# Patient Record
Sex: Female | Born: 1945 | Race: White | Hispanic: No | Smoking: Never smoker
Health system: Southern US, Community
[De-identification: ages and names within clinical notes are randomized; demographics above are authoritative.]

## PROBLEM LIST (undated history)

## (undated) DIAGNOSIS — K5790 Diverticulosis of intestine, part unspecified, without perforation or abscess without bleeding: Secondary | ICD-10-CM

## (undated) DIAGNOSIS — I10 Essential (primary) hypertension: Secondary | ICD-10-CM

## (undated) DIAGNOSIS — K8020 Calculus of gallbladder without cholecystitis without obstruction: Secondary | ICD-10-CM

## (undated) DIAGNOSIS — E7800 Pure hypercholesterolemia, unspecified: Secondary | ICD-10-CM

## (undated) DIAGNOSIS — D649 Anemia, unspecified: Secondary | ICD-10-CM

## (undated) DIAGNOSIS — E039 Hypothyroidism, unspecified: Secondary | ICD-10-CM

## (undated) DIAGNOSIS — M1990 Unspecified osteoarthritis, unspecified site: Secondary | ICD-10-CM

## (undated) DIAGNOSIS — T7840XA Allergy, unspecified, initial encounter: Secondary | ICD-10-CM

## (undated) DIAGNOSIS — E079 Disorder of thyroid, unspecified: Secondary | ICD-10-CM

## (undated) HISTORY — DX: Allergy, unspecified, initial encounter: T78.40XA

## (undated) HISTORY — PX: BUNIONECTOMY: SHX129

## (undated) HISTORY — PX: DIAGNOSTIC LAPAROSCOPY: SUR761

## (undated) HISTORY — DX: Unspecified osteoarthritis, unspecified site: M19.90

## (undated) HISTORY — DX: Disorder of thyroid, unspecified: E07.9

## (undated) HISTORY — DX: Anemia, unspecified: D64.9

---

## 1998-06-15 ENCOUNTER — Other Ambulatory Visit: Admission: RE | Admit: 1998-06-15 | Discharge: 1998-06-15 | Payer: Self-pay | Admitting: Gynecology

## 2002-02-19 ENCOUNTER — Other Ambulatory Visit: Admission: RE | Admit: 2002-02-19 | Discharge: 2002-02-19 | Payer: Self-pay | Admitting: Obstetrics and Gynecology

## 2003-03-27 ENCOUNTER — Other Ambulatory Visit: Admission: RE | Admit: 2003-03-27 | Discharge: 2003-03-27 | Payer: Self-pay | Admitting: Obstetrics and Gynecology

## 2005-04-06 ENCOUNTER — Other Ambulatory Visit: Admission: RE | Admit: 2005-04-06 | Discharge: 2005-04-06 | Payer: Self-pay | Admitting: Obstetrics and Gynecology

## 2005-06-19 ENCOUNTER — Ambulatory Visit (HOSPITAL_COMMUNITY): Admission: RE | Admit: 2005-06-19 | Discharge: 2005-06-19 | Payer: Self-pay | Admitting: Gastroenterology

## 2006-06-22 ENCOUNTER — Other Ambulatory Visit: Admission: RE | Admit: 2006-06-22 | Discharge: 2006-06-22 | Payer: Self-pay | Admitting: Obstetrics and Gynecology

## 2007-10-11 ENCOUNTER — Other Ambulatory Visit: Admission: RE | Admit: 2007-10-11 | Discharge: 2007-10-11 | Payer: Self-pay | Admitting: Obstetrics & Gynecology

## 2011-03-10 NOTE — Op Note (Signed)
NAME:  Dana Duffy, Dana Duffy               ACCOUNT NO.:  [id number]   MEDICAL RECORD NO.:  [id number]          PATIENT TYPE:  AMB   LOCATION:  ENDO                         FACILITY:  [facility]   PHYSICIAN:  Anselmo Rod, M.D.  DATE OF BIRTH:  September 07, 1946   DATE OF PROCEDURE:  06/19/2005  DATE OF DISCHARGE:                                 OPERATIVE REPORT   PROCEDURE:  Screening colonoscopy.   ENDOSCOPIST:  Charna Elizabeth, M.D.   INSTRUMENT USED:  Olympus video colonoscope.   INDICATIONS FOR PROCEDURE:  65 year old white female with a family history  of colon cancer in a paternal grandparent undergoing screening colonoscopy  to rule out colonic polyps, masses, etc.   PREPROCEDURE PREPARATION:  Informed consent was procured from the patient.  The patient was fasted for eight hours prior to the procedure and prepped  with a bottle of magnesium citrate and a gallon of GoLYTELY the night prior  to the procedure.  The risks and benefits of the procedure including a 10%  miss rate of cancer and polyps were explained to the patient, as well.   PREPROCEDURE PHYSICAL:  Patient with stable vital signs.  Neck supple.  Chest clear to auscultation.  S1 and S2 regular.  Abdomen soft with normal  bowel sounds.   DESCRIPTION OF PROCEDURE:  The patient was placed in the left lateral  decubitus position, sedated with 75 mg of Demerol and 7.5 mg Versed in slow  incremental doses.  Once the patient was adequately sedated, maintained on  low flow oxygen and continuous cardiac monitoring, the Olympus video  colonoscope was advanced into the rectum to the cecum.  The appendiceal  orifice and ileocecal valve were visualized and photographed.  The terminal  ileum appeared healthy and without lesions.  Except for small internal  hemorrhoids seen on retroflexion, no other abnormalities were noted.  The  patient tolerated the procedure well without immediate complications.   IMPRESSION:  Normal colonoscopy to the  terminal ileum except for small  nonbleeding internal hemorrhoids, no masses, polyps, or diverticula seen.   RECOMMENDATIONS:  1.  Continue a high fiber diet with liberal fluid intake.  2.  Repeat colonoscopy in the next ten years unless the patient develops any      abnormal symptoms in the interim.  3.  Outpatient follow up as the need arises in the future.      Anselmo Rod, M.D.  Electronically Signed     JNM/MEDQ  D:  06/19/2005  T:  06/19/2005  Job:  [id number]   cc:   Onalee Hua L. Reed Breech, M.D.  [facility]  [address]  [place], [place] [address]  Fax: [phone]

## 2012-04-16 ENCOUNTER — Ambulatory Visit (INDEPENDENT_AMBULATORY_CARE_PROVIDER_SITE_OTHER): Payer: Medicare Other | Admitting: Family Medicine

## 2012-04-16 VITALS — BP 164/103 | HR 69 | Temp 97.8°F | Resp 16 | Ht 66.0 in | Wt 170.0 lb

## 2012-04-16 DIAGNOSIS — M79671 Pain in right foot: Secondary | ICD-10-CM

## 2012-04-16 DIAGNOSIS — IMO0001 Reserved for inherently not codable concepts without codable children: Secondary | ICD-10-CM

## 2012-04-16 DIAGNOSIS — M79609 Pain in unspecified limb: Secondary | ICD-10-CM

## 2012-04-16 DIAGNOSIS — R21 Rash and other nonspecific skin eruption: Secondary | ICD-10-CM

## 2012-04-16 MED ORDER — DOXYCYCLINE HYCLATE 100 MG PO TABS: 100.0000 mg | ORAL_TABLET | Freq: Two times a day (BID) | ORAL | Status: AC

## 2012-04-16 NOTE — Patient Instructions (Addendum)
Try the hydrocortisone cream on the spot on your arm. I am also sending in an antibiotic for the next 7 days.  I will refer you to a Sports Medicine doctor for your foot.

## 2012-04-16 NOTE — Progress Notes (Signed)
Patient ID: Dana Duffy, female   DOB: 1946-02-04, 66 y.o.   MRN: [id number] Dana Duffy is a 66 y.o. female who [facility] for 2 issues today:  1. Left foot pain:  Chronic issue for patient.  Had bunionectomy several years ago performed by podiatrist here in town.  Pain began roughly 1 year ago in toes, now "spreading" up her foot to her ankle.  Does not some occasional swelling in dorsum of foot and ankle when she is on her feet most of the day.  Given orthotics which provide only intermittent relief.  Takes OTC ASA with some relief.    2.  Bug bite:  Noticed bruise on her Right arm, inner aspect, this AM.  Was not itching at that time.  Did not note any insect bite, no tick.  Has spread with some central clearing as day progresses.    PMH reviewed.  ROS as above otherwise neg Medications reviewed. Current Outpatient Prescriptions  Medication Sig Dispense Refill  . simvastatin (ZOCOR) 40 MG tablet Take 40 mg by mouth every evening.        Exam:  BP 164/103  Pulse 69  Temp 97.8 F (36.6 C) (Oral)  Resp 16  Ht 5\' 6"  (1.676 m)  Wt 170 lb (77.111 kg)  BMI 27.44 kg/m2 Gen: Well NAD HEENT: EOMI,  MMM Lungs: CTABL Nl WOB Heart: RRR no MRG Skin:  2 cm area of erythema with central lesion noted medial aspect Right arm, some bruising noted.  No puncture wound.  No scaling.  No other rashes noted Exts: Non edematous BL  LE, warm and well perfused.  MSK:  Not currently tender.  No limping with ambulation.  No pain with eversion, plantar flexion, dorsiflexion of foot.  Assessment and Plan:  1.  Foot pain:  Likely tendonitis due to chronicity of symptoms.  No rash or red flags noted.  Patient ambulated easily today in clinic.  To continue intermittent ASA with relief, discussed switching to another NSAID but she declined this.   2.  Bug bite:  Unclear etiology of rash.   DOes have central clearing and although no history of tick bite the benefits outweigh the risks with  treating with Doxycycline.  FU if no improvement.

## 2012-04-17 ENCOUNTER — Encounter: Payer: Self-pay | Admitting: Internal Medicine

## 2012-06-12 ENCOUNTER — Encounter: Payer: Self-pay | Admitting: Internal Medicine

## 2012-06-12 ENCOUNTER — Ambulatory Visit (INDEPENDENT_AMBULATORY_CARE_PROVIDER_SITE_OTHER): Payer: Medicare Other | Admitting: Internal Medicine

## 2012-06-12 VITALS — BP 162/86 | HR 60 | Temp 97.2°F | Resp 18 | Ht 66.5 in | Wt 171.0 lb

## 2012-06-12 DIAGNOSIS — E039 Hypothyroidism, unspecified: Secondary | ICD-10-CM

## 2012-06-12 DIAGNOSIS — E785 Hyperlipidemia, unspecified: Secondary | ICD-10-CM

## 2012-06-12 DIAGNOSIS — Z0000 Encounter for general adult medical examination without abnormal findings: Secondary | ICD-10-CM

## 2012-06-12 DIAGNOSIS — Z6827 Body mass index (BMI) 27.0-27.9, adult: Secondary | ICD-10-CM | POA: Insufficient documentation

## 2012-06-12 LAB — CBC WITH DIFFERENTIAL/PLATELET
Basophils Absolute: 0 10*3/uL (ref 0.0–0.1)
HCT: 41 % (ref 36.0–46.0)
Lymphocytes Relative: 37 % (ref 12–46)
Monocytes Absolute: 0.5 10*3/uL (ref 0.1–1.0)
Neutro Abs: 2.3 10*3/uL (ref 1.7–7.7)
RDW: 14 % (ref 11.5–15.5)
WBC: 4.6 10*3/uL (ref 4.0–10.5)

## 2012-06-12 LAB — POCT URINALYSIS DIPSTICK
Glucose, UA: NEGATIVE
Nitrite, UA: NEGATIVE
Urobilinogen, UA: 0.2

## 2012-06-12 NOTE — Progress Notes (Signed)
  Subjective:    Patient ID: Dana Duffy, female    DOB: 01-02-1946, 66 y.o.   MRN: [id number]  HPIF/U Annual checkup Doing well in general Patient Active Problem List  Diagnosis  . BMI 27.0-27.9,adult  . Hypothyroidism  . Hyperlipidemia  Foot pain from 2nd toes hammering has been eval by both pod anf ortho([facility]), both saying "surgery" She has increased yoga and started foot massage and is doing much better!!  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 to support younger sis w/ husband's early alz after a viral illness  Mammo soon/high density zostavax Bone dens-"no" she says fam hx good for her= Mother lived 33s and aunt into the 11s without problems colonscopy =med hemmorr/polyp Bx pending   BP 172/82 at ortho-  Review of Systems Wt gain 163--170/less exer No decrease in activity level HEENT =no sx No nodes CVR neg Lots of energy/sleeping well GI-negat GU-negat Skin=no challerg=mold increased sxt in July/itchy skin also  No neuropsych symptoms of depression Objective:   Physical Exam Filed Vitals:   06/12/12 1130  BP: 162/86  Pulse: 60  Temp: 97.2 F (36.2 C)  Resp: 18  HEENT clear No thyromegaly or lymphadenopathy Heart regular without murmur click Lungs clear Abdomen soft without organomegaly No peripheral edema/4 peripheral pulses/no bruits abdomen or inguinal Neurological intact Mood very good/affect appropriate/judgment intact       Assessment & Plan:   1. Hypothyroid  TSH, T4, Free  2. Hyperlipidemia  CBC with Differential, Comprehensive metabolic panel, Lipid panel  3. Annual physical exam  POCT urinalysis dipstick  4. BMI 27.0-27.9,adult  Discussed ways to change diet and exercise  5.   Increased blood pressure without diagnosis of hypertension 6.   Hammer  toes-foot pain---Continue current plan   Will check labs then adjust meds and describe approach to HTN

## 2012-06-13 LAB — COMPREHENSIVE METABOLIC PANEL
ALT: 25 U/L (ref 0–35)
AST: 27 U/L (ref 0–37)
Albumin: 4.3 g/dL (ref 3.5–5.2)
Calcium: 9.4 mg/dL (ref 8.4–10.5)
Chloride: 104 mEq/L (ref 96–112)
Potassium: 4 mEq/L (ref 3.5–5.3)
Sodium: 141 mEq/L (ref 135–145)
Total Protein: 6.5 g/dL (ref 6.0–8.3)

## 2012-06-13 LAB — T4, FREE: Free T4: 1.43 ng/dL (ref 0.80–1.80)

## 2012-06-13 LAB — LIPID PANEL: LDL Cholesterol: 122 mg/dL — ABNORMAL HIGH (ref 0–99)

## 2012-06-16 ENCOUNTER — Encounter: Payer: Self-pay | Admitting: Internal Medicine

## 2012-06-16 ENCOUNTER — Other Ambulatory Visit: Payer: Self-pay | Admitting: Internal Medicine

## 2012-06-29 ENCOUNTER — Other Ambulatory Visit: Payer: Self-pay | Admitting: Physician Assistant

## 2012-07-01 ENCOUNTER — Telehealth: Payer: Self-pay | Admitting: *Deleted

## 2012-07-01 MED ORDER — LEVOTHYROXINE SODIUM 100 MCG PO TABS: 100.0000 ug | ORAL_TABLET | Freq: Every day | ORAL | Status: DC

## 2012-07-01 NOTE — Telephone Encounter (Signed)
rx sent

## 2012-07-01 NOTE — Telephone Encounter (Signed)
rx refill for Sythroid last fill #90 06/06

## 2012-07-01 NOTE — Telephone Encounter (Signed)
Called pt [facility] RX sent in. 

## 2012-07-02 ENCOUNTER — Other Ambulatory Visit: Payer: Self-pay | Admitting: Family Medicine

## 2012-09-04 ENCOUNTER — Encounter: Payer: Self-pay | Admitting: Physician Assistant

## 2012-09-16 ENCOUNTER — Other Ambulatory Visit: Payer: Self-pay | Admitting: Physician Assistant

## 2012-12-18 ENCOUNTER — Other Ambulatory Visit: Payer: Self-pay | Admitting: Physician Assistant

## 2012-12-19 ENCOUNTER — Telehealth: Payer: Self-pay

## 2012-12-19 MED ORDER — LEVOTHYROXINE SODIUM 100 MCG PO TABS: 100.0000 ug | ORAL_TABLET | Freq: Every day | ORAL | Status: DC

## 2012-12-19 MED ORDER — SIMVASTATIN 20 MG PO TABS: 20.0000 mg | ORAL_TABLET | Freq: Every day | ORAL | Status: DC

## 2012-12-19 NOTE — Telephone Encounter (Signed)
error 

## 2013-04-16 ENCOUNTER — Encounter: Payer: Medicare Other | Admitting: Internal Medicine

## 2013-04-18 ENCOUNTER — Encounter: Payer: Medicare Other | Admitting: Internal Medicine

## 2013-05-07 ENCOUNTER — Encounter: Payer: Self-pay | Admitting: Internal Medicine

## 2013-05-07 ENCOUNTER — Ambulatory Visit (INDEPENDENT_AMBULATORY_CARE_PROVIDER_SITE_OTHER): Payer: Medicare Other | Admitting: Internal Medicine

## 2013-05-07 VITALS — BP 150/80 | HR 62 | Temp 98.4°F | Resp 16 | Ht 66.0 in | Wt 168.2 lb

## 2013-05-07 DIAGNOSIS — E039 Hypothyroidism, unspecified: Secondary | ICD-10-CM

## 2013-05-07 DIAGNOSIS — E785 Hyperlipidemia, unspecified: Secondary | ICD-10-CM

## 2013-05-07 DIAGNOSIS — Z6827 Body mass index (BMI) 27.0-27.9, adult: Secondary | ICD-10-CM

## 2013-05-07 LAB — COMPREHENSIVE METABOLIC PANEL
ALT: 16 U/L (ref 0–35)
AST: 22 U/L (ref 0–37)
Albumin: 4 g/dL (ref 3.5–5.2)
Calcium: 9.4 mg/dL (ref 8.4–10.5)
Chloride: 105 mEq/L (ref 96–112)
Potassium: 4.2 mEq/L (ref 3.5–5.3)
Sodium: 140 mEq/L (ref 135–145)
Total Protein: 6.4 g/dL (ref 6.0–8.3)

## 2013-05-07 LAB — LIPID PANEL
LDL Cholesterol: 96 mg/dL (ref 0–99)
Triglycerides: 50 mg/dL (ref <150)
VLDL: 10 mg/dL (ref 0–40)

## 2013-05-07 LAB — CBC WITH DIFFERENTIAL/PLATELET
Basophils Absolute: 0 10*3/uL (ref 0.0–0.1)
Eosinophils Absolute: 0.1 10*3/uL (ref 0.0–0.7)
Eosinophils Relative: 3 % (ref 0–5)
MCH: 31.6 pg (ref 26.0–34.0)
MCV: 93 fL (ref 78.0–100.0)
Platelets: 196 10*3/uL (ref 150–400)
RDW: 13.7 % (ref 11.5–15.5)
WBC: 5.1 10*3/uL (ref 4.0–10.5)

## 2013-05-07 NOTE — Progress Notes (Signed)
  Subjective:    Patient ID: Dana Duffy, female    DOB: 1946/07/28, 67 y.o.   MRN: [id number]  HPI  Patient Active Problem List   Diagnosis Date Noted  . BMI 27.0-27.9,adult 06/12/2012  . Hypothyroidism 06/12/2012  . Hyperlipidemia 06/12/2012  Doing well Work-artist/restoration Divorced  At least one grandchild Son in [place] and one at [place] [employer] Hammer toes-g [place]  Review of Systems All negative x gen aches and pains that improve w/ activity    Objective:   Physical Exam BP 150/80  Pulse 62  Temp(Src) 98.4 F (36.9 C) (Oral)  Resp 16  Ht 5\' 6"  (1.676 m)  Wt 168 lb 3.2 oz (76.295 kg)  BMI 27.16 kg/m2  SpO2 98% HEENT clear Ht reg extr clear x htoe R#2 Neuro intact psy stab       Assessment & Plan:  BMI 27.0-27.9,adult  Hypothyroidism - Plan: CBC with Differential, TSH, T4, free  Hyperlipidemia - Plan: Comprehensive metabolic panel, Lipid panel  meds after labs Contin wt loss

## 2013-05-09 ENCOUNTER — Encounter: Payer: Self-pay | Admitting: Internal Medicine

## 2013-05-09 MED ORDER — SIMVASTATIN 20 MG PO TABS: 20.0000 mg | ORAL_TABLET | Freq: Every day | ORAL | Status: DC

## 2013-05-09 MED ORDER — LEVOTHYROXINE SODIUM 100 MCG PO TABS: 100.0000 ug | ORAL_TABLET | Freq: Every day | ORAL | Status: DC

## 2013-05-09 NOTE — Addendum Note (Signed)
Addended by: Tonye Pearson on: 05/09/2013 02:49 PM   Modules accepted: Orders, Medications

## 2013-06-18 ENCOUNTER — Encounter: Payer: Medicare Other | Admitting: Internal Medicine

## 2013-07-02 ENCOUNTER — Encounter: Payer: Self-pay | Admitting: Internal Medicine

## 2013-07-02 ENCOUNTER — Ambulatory Visit (INDEPENDENT_AMBULATORY_CARE_PROVIDER_SITE_OTHER): Payer: Medicare Other | Admitting: Internal Medicine

## 2013-07-02 VITALS — BP 142/84 | HR 59 | Temp 98.0°F | Resp 16 | Ht 66.75 in | Wt 167.6 lb

## 2013-07-02 DIAGNOSIS — Z23 Encounter for immunization: Secondary | ICD-10-CM

## 2013-07-02 DIAGNOSIS — Z0000 Encounter for general adult medical examination without abnormal findings: Secondary | ICD-10-CM

## 2013-07-02 NOTE — Progress Notes (Signed)
  Subjective:    Patient ID: Dana Duffy, female    DOB: 06-Sep-1946, 67 y.o.   MRN: [id number]  HPIannual exam Patient Active Problem List   Diagnosis Date Noted  . Erroneous encounter - disregard 07/02/2012  . BMI 27.0-27.9,adult 06/12/2012  . Hypothyroidism 06/12/2012  . Hyperlipidemia 06/12/2012   Current outpatient prescriptions:acidophilus (RISAQUAD) CAPS, Take by mouth daily., Disp: , Rfl: ;  Calcium Carbonate (CALCIUM 600 PO), Take by mouth daily., Disp: , Rfl: ;  Cholecalciferol (VITAMIN D) 1000 UNITS capsule, Take 1,000 Units by mouth daily., Disp: , Rfl: ;  levothyroxine (SYNTHROID) 100 MCG tablet, Take 1 tablet (100 mcg total) by mouth daily., Disp: 90 tablet, Rfl: 3 simvastatin (ZOCOR) 20 MG tablet, Take 1 tablet (20 mg total) by mouth at bedtime., Disp: 90 tablet, Rfl: 3  Everything ok except has occasional constipation. Uses metamucil type medicine.Has had improvement with drinking coffee first thing in the morning.  Last pap 2012, last mammogram 2103      Review of Systems  Constitutional: Negative for fever, activity change, appetite change, fatigue and unexpected weight change.  HENT: Negative for hearing loss, congestion, rhinorrhea, trouble swallowing, neck pain and dental problem.   Eyes: Negative for photophobia and visual disturbance.  Respiratory: Negative for cough, shortness of breath and wheezing.   Cardiovascular: Negative for chest pain, palpitations and leg swelling.  Gastrointestinal: Negative for abdominal pain, diarrhea, constipation and blood in stool.  Genitourinary: Negative for difficulty urinating, menstrual problem, pelvic pain and dyspareunia.  Musculoskeletal: Negative for myalgias, back pain, joint swelling, arthralgias and gait problem.  Skin: Negative for rash.  Neurological: Negative for dizziness, speech difficulty and headaches.  Hematological: Negative for adenopathy. Does not bruise/bleed easily.  Psychiatric/Behavioral: Negative for  behavioral problems, sleep disturbance and dysphoric mood.       Objective:   Physical Exam  Constitutional: She is oriented to person, place, and time. She appears well-developed and well-nourished.  HENT:  Right Ear: Tympanic membrane, external ear and ear canal normal.  Left Ear: Tympanic membrane, external ear and ear canal normal.  Nose: Nose normal.  Mouth/Throat: Oropharynx is clear and moist.  Eyes: Conjunctivae and EOM are normal. Pupils are equal, round, and reactive to light.  Neck: Normal range of motion. Neck supple. No thyromegaly present.  Cardiovascular: Normal rate, regular rhythm and normal heart sounds.   Pulmonary/Chest: Effort normal and breath sounds normal. She has no wheezes.  Abdominal: Soft. Bowel sounds are normal. She exhibits no distension and no mass. There is no tenderness. There is no rebound and no guarding.  Genitourinary: Vagina normal and uterus normal. No vaginal discharge found.  Musculoskeletal: Normal range of motion. She exhibits no edema.  Lymphadenopathy:    She has no cervical adenopathy.  Neurological: She is alert and oriented to person, place, and time. She has normal reflexes. No cranial nerve deficit.  Skin: Skin is warm and dry.  Psychiatric: She has a normal mood and affect. Her behavior is normal. Judgment and thought content normal.          Assessment & Plan:  Annual physical exam - Plan: Pap IG and HPV (high risk) DNA detection, CANCELED: Pap IG and HPV (high risk) DNA detection  Need for prophylactic vaccination and inoculation against influenza - Plan: Flu Vaccine QUAD 36+ mos IM, Pap IG and HPV (high risk) DNA detection  Labs were in July Ok to ref meds thru next sept if calls

## 2013-07-02 NOTE — Progress Notes (Signed)
  Subjective:    Patient ID: Dana Duffy, female    DOB: 08-21-1946, 67 y.o.   MRN: [id number]  HPI    Review of Systems  Constitutional: Negative.   HENT: Negative.   Eyes: Negative.   Respiratory: Negative.   Cardiovascular: Positive for leg swelling.  Gastrointestinal: Negative.   Endocrine: Negative.   Genitourinary: Negative.   Musculoskeletal: Positive for myalgias and back pain.  Skin: Negative.   Allergic/Immunologic: Negative.   Neurological: Negative.   Hematological: Negative.   Psychiatric/Behavioral: Negative.        Objective:   Physical Exam        Assessment & Plan:

## 2013-07-04 LAB — PAP IG AND HPV HIGH-RISK: HPV DNA High Risk: NOT DETECTED

## 2013-07-10 ENCOUNTER — Encounter: Payer: Self-pay | Admitting: Internal Medicine

## 2013-09-22 LAB — HM MAMMOGRAPHY

## 2013-10-02 ENCOUNTER — Ambulatory Visit (INDEPENDENT_AMBULATORY_CARE_PROVIDER_SITE_OTHER): Payer: Medicare Other | Admitting: Internal Medicine

## 2013-10-02 VITALS — BP 126/92 | HR 62 | Temp 97.7°F | Resp 18 | Ht 66.0 in | Wt 166.0 lb

## 2013-10-02 DIAGNOSIS — L299 Pruritus, unspecified: Secondary | ICD-10-CM

## 2013-10-02 DIAGNOSIS — L259 Unspecified contact dermatitis, unspecified cause: Secondary | ICD-10-CM

## 2013-10-02 MED ORDER — METHYLPREDNISOLONE ACETATE 80 MG/ML IJ SUSP
80.0000 mg | Freq: Once | INTRAMUSCULAR | Status: AC
  Administered 2013-10-02: 80 mg via INTRAMUSCULAR

## 2013-10-02 MED ORDER — PREDNISONE 10 MG PO TABS: ORAL_TABLET | ORAL | Status: DC

## 2013-10-02 MED ORDER — CLOBETASOL PROPIONATE 0.05 % EX OINT: 1.0000 "application " | TOPICAL_OINTMENT | Freq: Two times a day (BID) | CUTANEOUS | Status: DC

## 2013-10-02 NOTE — Progress Notes (Signed)
   Subjective:    Patient ID: Dana Duffy, female    DOB: July 15, 1946, 67 y.o.   MRN: [id number]  HPI Patient comes into our office with a rash that started under her right breast then that went away then it went to her left breast to the side of her left hip and in the middle of her back this has been going on for about a month now. She has used OTC creams for the itch and redness. Doesn't remember using any new soaps or perfumes no new clothing. Patient was outside recently raking leaves. She do have two cats. The rash doesn't doesn't hurt only when she scratches it    Review of Systems  Constitutional: Negative for fever and chills.  Gastrointestinal: Negative for nausea and vomiting.  Skin: Positive for rash.       Objective:   Physical Exam        Assessment & Plan:

## 2013-10-02 NOTE — Patient Instructions (Signed)

## 2013-10-12 ENCOUNTER — Ambulatory Visit: Payer: Medicare Other

## 2013-10-12 ENCOUNTER — Ambulatory Visit (INDEPENDENT_AMBULATORY_CARE_PROVIDER_SITE_OTHER): Payer: Medicare Other | Admitting: Family Medicine

## 2013-10-12 VITALS — BP 120/70 | HR 60 | Temp 98.0°F | Resp 16 | Ht 66.0 in | Wt 166.0 lb

## 2013-10-12 DIAGNOSIS — R002 Palpitations: Secondary | ICD-10-CM

## 2013-10-12 DIAGNOSIS — R0609 Other forms of dyspnea: Secondary | ICD-10-CM

## 2013-10-12 DIAGNOSIS — R0989 Other specified symptoms and signs involving the circulatory and respiratory systems: Secondary | ICD-10-CM

## 2013-10-12 DIAGNOSIS — E039 Hypothyroidism, unspecified: Secondary | ICD-10-CM

## 2013-10-12 DIAGNOSIS — J0100 Acute maxillary sinusitis, unspecified: Secondary | ICD-10-CM

## 2013-10-12 DIAGNOSIS — R0600 Dyspnea, unspecified: Secondary | ICD-10-CM

## 2013-10-12 LAB — POCT CBC
Granulocyte percent: 64.8 %G (ref 37–80)
HCT, POC: 43.7 % (ref 37.7–47.9)
Hemoglobin: 13.4 g/dL (ref 12.2–16.2)
Lymph, poc: 2.2 (ref 0.6–3.4)
MCH, POC: 30.9 pg (ref 27–31.2)
MCHC: 30.7 g/dL — AB (ref 31.8–35.4)
MCV: 101 fL — AB (ref 80–97)
MID (cbc): 0.5 (ref 0–0.9)
Platelet Count, POC: 189 10*3/uL (ref 142–424)
RBC: 4.33 M/uL (ref 4.04–5.48)
WBC: 7.5 10*3/uL (ref 4.6–10.2)

## 2013-10-12 MED ORDER — AMOXICILLIN 500 MG PO CAPS: 1000.0000 mg | ORAL_CAPSULE | Freq: Two times a day (BID) | ORAL | Status: DC

## 2013-10-12 NOTE — Patient Instructions (Signed)
1.  Please present to emergency department for worsening palpitations. 2. We will contact you in the upcoming week for cardiology appointment.

## 2013-10-12 NOTE — Progress Notes (Addendum)
Subjective:    Patient ID: Dana Duffy, female    DOB: 11-06-1945, 67 y.o.   MRN: [id number] This chart was scribed for Ethelda Chick, MD by Danella Maiers, ED Scribe. This patient was seen in room 10 and the patient's care was started at 4:08 PM.  Chief Complaint  Patient presents with  . Sinusitis    x  . Palpitations    not now    HPI HPI Comments: Dana Duffy is a 67 y.o. female with a h/o hypothyroidism who presents to the [facility] complaining of palpitations described as "heart is jumping" over the last 4-5 days. Pt states she feels like her heart is racing although she doesn't think she is actually tachycardic. She states yesterday she got very SOB after going up 4 flights of stairs which is unusual for her. She denies SOB when walking around the house or when laying down at night. She reports posterior headache last night. She denies prior h/o jumping heart. She denies CP, fever, chills, sweats, leg swelling. She denies recent changes in medications.  Pt is also c/o sinus infection intermittently over the last 4-5 days since finishing a course of prednisone. She reports light-headedness and states she thinks it is related to the sinus infection. She reports mild drainage in the back of her throat this morning. She reports mild nausea that did not last long. She reports she has a h/o frequent sinus infections. She states she slept with her humidifier last night and felt a little better today.  She also reports a lump in her throat described as feeling like something got stuck in her throat. She states this has partially resolved. She has a h/o similar symptoms and states it is due to gallbladder symptoms.   PCP - Dr Merla Riches    Past Medical History  Diagnosis Date  . Allergy   . Arthritis   . Anemia   . Thyroid disease    Current Outpatient Prescriptions on File Prior to Visit  Medication Sig Dispense Refill  . acidophilus (RISAQUAD) CAPS Take by  mouth daily.      . Calcium Carbonate (CALCIUM 600 PO) Take by mouth daily.      . Cholecalciferol (VITAMIN D) 1000 UNITS capsule Take 1,000 Units by mouth daily.      Marland Kitchen levothyroxine (SYNTHROID) 100 MCG tablet Take 1 tablet (100 mcg total) by mouth daily.  90 tablet  3  . Psyllium (METAMUCIL PO) Take by mouth.      . simvastatin (ZOCOR) 20 MG tablet Take 1 tablet (20 mg total) by mouth at bedtime.  90 tablet  3  . DiphenhydrAMINE HCl (BENADRYL ALLERGY PO) Take by mouth.       No current facility-administered medications on file prior to visit.   Allergies  Allergen Reactions  . Sulfa Antibiotics Other (See Comments)    SKIN REDNESS    Review of Systems  Constitutional: Negative for fever, chills and diaphoresis.  HENT: Positive for postnasal drip and sinus pressure.   Respiratory: Positive for shortness of breath (after going up stairs but not when lying down).   Cardiovascular: Positive for palpitations. Negative for chest pain and leg swelling.  Gastrointestinal: Positive for nausea.  Neurological: Positive for headaches.       Objective:   Physical Exam  Nursing note and vitals reviewed. Constitutional: She is oriented to person, place, and time. She appears well-developed and well-nourished. No distress.  HENT:  Head: Normocephalic and atraumatic.  Right Ear: Tympanic membrane normal.  Left Ear: Tympanic membrane normal.  Nose: Nose normal.  Mouth/Throat: Oropharyngeal exudate and posterior oropharyngeal erythema present.  Eyes: EOM are normal.  Neck: Neck supple. Carotid bruit is not present. No tracheal deviation present.  Cardiovascular: Normal rate and regular rhythm.   Murmur (1/6 at the left stomach border) heard.  Systolic murmur is present with a grade of 1/6  Pulses:      Dorsalis pedis pulses are 2+ on the right side, and 2+ on the left side.  No leg swelling  Pulmonary/Chest: Effort normal and breath sounds normal. No respiratory distress. She has no  wheezes. She has no rhonchi. She has no rales.  Musculoskeletal: Normal range of motion.  Lymphadenopathy:    She has no cervical adenopathy.  Neurological: She is alert and oriented to person, place, and time.  Skin: Skin is warm and dry.  Psychiatric: She has a normal mood and affect. Her behavior is normal.    Filed Vitals:   10/12/13 1603  BP: 120/70  Pulse: 60  Temp: 98 F (36.7 C)  TempSrc: Oral  Resp: 16  Height: 5\' 6"  (1.676 m)  Weight: 166 lb (75.297 kg)  SpO2: 98%   Results for orders placed in visit on 10/12/13  COMPREHENSIVE METABOLIC PANEL      Result Value Ref Range   Sodium 139  135 - 145 mEq/L   Potassium 4.0  3.5 - 5.3 mEq/L   Chloride 104  96 - 112 mEq/L   CO2 27  19 - 32 mEq/L   Glucose, Bld 96  70 - 99 mg/dL   BUN 21  6 - 23 mg/dL   Creat 1.61  0.96 - 0.45 mg/dL   Total Bilirubin 1.0  0.3 - 1.2 mg/dL   Alkaline Phosphatase 63  39 - 117 U/L   AST 17  0 - 37 U/L   ALT 11  0 - 35 U/L   Total Protein 6.3  6.0 - 8.3 g/dL   Albumin 3.8  3.5 - 5.2 g/dL   Calcium 9.3  8.4 - 40.9 mg/dL  TSH      Result Value Ref Range   TSH 0.659  0.350 - 4.500 uIU/mL  POCT CBC      Result Value Ref Range   WBC 7.5  4.6 - 10.2 K/uL   Lymph, poc 2.2  0.6 - 3.4   POC LYMPH PERCENT 28.7  10 - 50 %L   MID (cbc) 0.5  0 - 0.9   POC MID % 6.5  0 - 12 %M   POC Granulocyte 4.9  2 - 6.9   Granulocyte percent 64.8  37 - 80 %G   RBC 4.33  4.04 - 5.48 M/uL   Hemoglobin 13.4  12.2 - 16.2 g/dL   HCT, POC 81.1  91.4 - 47.9 %   MCV 101.0 (*) 80 - 97 fL   MCH, POC 30.9  27 - 31.2 pg   MCHC 30.7 (*) 31.8 - 35.4 g/dL   RDW, POC 78.2     Platelet Count, POC 189  142 - 424 K/uL   MPV 9.2  0 - 99.8 fL   [facility] reading (PRIMARY) by  Dr. Katrinka Blazing.  CXR:  NAD  EKG:  Sinus brady at 52; no ST changes; no arrhythmia.       Assessment & Plan:   1. Palpitations   2. Acute maxillary sinusitis   3. Hypothyroidism   4. Dyspnea  1. Palpitations: New.  Normal EKG with sinus bradycardia.   Obtain labs.  Possibly side effect of recent Prednisone taper; if so, palpitations should not recur. If does persists, refer to cardiology for Holter or Event monitor. 2.  Acute maxillary sinusitis: New. Rx for Amoxicillin provided.   3. Hypothyroidism: stable; obtain labs.   4. Dyspnea on exertion:  New.  Normal CXR; normal respiratory exam; normal EKG.  Obtain labs. If worsens or persists, contact office.  Possibly related to acute illness.  Meds ordered this encounter  Medications  . DISCONTD: amoxicillin (AMOXIL) 500 MG capsule    Sig: Take 2 capsules (1,000 mg total) by mouth 2 (two) times daily.    Dispense:  40 capsule    Refill:  0    I personally performed the services described in this documentation, which was scribed in my presence.  The recorded information has been reviewed and is accurate.  Nilda Simmer, M.D.  [facility] [address] [place], [place]  [address] [phone] phone [phone] fax

## 2013-10-13 LAB — COMPREHENSIVE METABOLIC PANEL
BUN: 21 mg/dL (ref 6–23)
CO2: 27 mEq/L (ref 19–32)
Calcium: 9.3 mg/dL (ref 8.4–10.5)
Chloride: 104 mEq/L (ref 96–112)
Creat: 0.76 mg/dL (ref 0.50–1.10)
Glucose, Bld: 96 mg/dL (ref 70–99)
Total Protein: 6.3 g/dL (ref 6.0–8.3)

## 2013-10-13 LAB — TSH: TSH: 0.659 u[IU]/mL (ref 0.350–4.500)

## 2013-10-20 ENCOUNTER — Encounter: Payer: Self-pay | Admitting: Internal Medicine

## 2013-10-21 ENCOUNTER — Telehealth: Payer: Self-pay

## 2013-10-21 NOTE — Telephone Encounter (Signed)
Patient called about labs, advised patient Dr. Katrinka Blazing will review labs.   Patient states understanding.

## 2013-10-24 ENCOUNTER — Encounter: Payer: Self-pay | Admitting: Internal Medicine

## 2013-10-29 ENCOUNTER — Encounter: Payer: Self-pay | Admitting: Family Medicine

## 2013-10-29 ENCOUNTER — Encounter: Payer: Self-pay | Admitting: Radiology

## 2014-04-05 ENCOUNTER — Ambulatory Visit (INDEPENDENT_AMBULATORY_CARE_PROVIDER_SITE_OTHER): Payer: Medicare Other | Admitting: Internal Medicine

## 2014-04-05 VITALS — BP 150/76 | HR 65 | Temp 98.0°F | Resp 20 | Ht 65.5 in | Wt 164.4 lb

## 2014-04-05 DIAGNOSIS — S139XXA Sprain of joints and ligaments of unspecified parts of neck, initial encounter: Secondary | ICD-10-CM

## 2014-04-05 DIAGNOSIS — R030 Elevated blood-pressure reading, without diagnosis of hypertension: Secondary | ICD-10-CM

## 2014-04-05 DIAGNOSIS — S161XXA Strain of muscle, fascia and tendon at neck level, initial encounter: Secondary | ICD-10-CM

## 2014-04-05 NOTE — Progress Notes (Signed)
   Subjective:    Patient ID: Dana Duffy, female    DOB: January 18, 1946, 68 y.o.   MRN: [id number]  HPI she has had slightly elevated blood pressure since her colonoscopy 5 days ago She has also had neck discomfort for the past 4 days There is no clear injury This pain is not associated with any weakness or sensory abnormalities She does not have a prior neck problem There no headaches although she feels somewhat tender into her occipital area bilaterally   Patient Active Problem List   Diagnosis Date Noted  . BMI 27.0-27.9,adult 06/12/2012  . Hypothyroidism 06/12/2012  . Hyperlipidemia 06/12/2012   Current outpatient prescriptions:acidophilus (RISAQUAD) CAPS, Take by mouth daily., Disp: , Rfl: ;  Calcium Carbonate (CALCIUM 600 PO), Take by mouth daily., Disp: , Rfl: ;  Cholecalciferol (VITAMIN D) 1000 UNITS capsule, Take 1,000 Units by mouth daily., Disp: , Rfl: ;  DiphenhydrAMINE HCl (BENADRYL ALLERGY PO), Take by mouth., Disp: , Rfl:  levothyroxine (SYNTHROID) 100 MCG tablet, Take 1 tablet (100 mcg total) by mouth daily., Disp: 90 tablet, Rfl: 3;  Psyllium (METAMUCIL PO), Take by mouth., Disp: , Rfl: ;  simvastatin (ZOCOR) 20 MG tablet, Take 1 tablet (20 mg total) by mouth at bedtime., Disp: 90 tablet, Rfl: 3  Review of Systems Today gi upset-nausea w/lots of BMs but no vom or diarr//roomate has same    Objective:   Physical Exam BP 150/76  Pulse 65  Temp(Src) 98 F (36.7 C) (Oral)  Resp 20  Ht 5' 5.5" (1.664 m)  Wt 164 lb 6.4 oz (74.571 kg)  BMI 26.93 kg/m2  SpO2 96% No acute distress HEENT clear Heart regular without murmur She is tender in the right trapezius and the right posterior cervical area with pain on range of motion that is mild Deep tendon reflexes are preserved No sensory losses are noted in the extremities       Assessment & Plan:  Cervical strain--I think this is related to the position she was in during her colonoscopy and should resolve with range of  motion, heat and ice, and some specific yoga movements  Increased blood pressure (not hypertension)  This will be followed as it is probably secondary to the above  She would prefer to take no medicines

## 2014-04-10 ENCOUNTER — Encounter: Payer: Self-pay | Admitting: Internal Medicine

## 2014-04-27 ENCOUNTER — Encounter: Payer: Medicare Other | Admitting: Family Medicine

## 2014-06-08 ENCOUNTER — Encounter: Payer: Self-pay | Admitting: Internal Medicine

## 2014-06-15 ENCOUNTER — Encounter: Payer: Self-pay | Admitting: Family Medicine

## 2014-06-15 ENCOUNTER — Ambulatory Visit (INDEPENDENT_AMBULATORY_CARE_PROVIDER_SITE_OTHER): Payer: Medicare Other | Admitting: Family Medicine

## 2014-06-15 VITALS — BP 160/90 | HR 56 | Temp 97.8°F | Resp 16 | Ht 66.0 in | Wt 163.6 lb

## 2014-06-15 DIAGNOSIS — M2042 Other hammer toe(s) (acquired), left foot: Secondary | ICD-10-CM

## 2014-06-15 DIAGNOSIS — R030 Elevated blood-pressure reading, without diagnosis of hypertension: Secondary | ICD-10-CM

## 2014-06-15 DIAGNOSIS — M2040 Other hammer toe(s) (acquired), unspecified foot: Secondary | ICD-10-CM

## 2014-06-15 DIAGNOSIS — E785 Hyperlipidemia, unspecified: Secondary | ICD-10-CM

## 2014-06-15 DIAGNOSIS — Z0000 Encounter for general adult medical examination without abnormal findings: Secondary | ICD-10-CM

## 2014-06-15 DIAGNOSIS — E039 Hypothyroidism, unspecified: Secondary | ICD-10-CM

## 2014-06-15 LAB — CBC
HCT: 42.3 % (ref 36.0–46.0)
Hemoglobin: 14.4 g/dL (ref 12.0–15.0)
MCH: 31.2 pg (ref 26.0–34.0)
MCHC: 34 g/dL (ref 30.0–36.0)
MCV: 91.6 fL (ref 78.0–100.0)
Platelets: 190 10*3/uL (ref 150–400)
RBC: 4.62 MIL/uL (ref 3.87–5.11)
RDW: 13.8 % (ref 11.5–15.5)
WBC: 4.6 10*3/uL (ref 4.0–10.5)

## 2014-06-15 LAB — LIPID PANEL
Cholesterol: 195 mg/dL (ref 0–200)
HDL: 83 mg/dL (ref >39)
LDL Cholesterol: 99 mg/dL (ref 0–99)
Total CHOL/HDL Ratio: 2.3 Ratio
Triglycerides: 66 mg/dL (ref <150)
VLDL: 13 mg/dL (ref 0–40)

## 2014-06-15 LAB — POCT URINALYSIS DIPSTICK
Bilirubin, UA: NEGATIVE
Glucose, UA: NEGATIVE
Ketones, UA: NEGATIVE
Nitrite, UA: NEGATIVE
Protein, UA: NEGATIVE
Spec Grav, UA: <=1.005
Urobilinogen, UA: 0.2
pH, UA: 6

## 2014-06-15 LAB — COMPREHENSIVE METABOLIC PANEL
ALT: 13 U/L (ref 0–35)
AST: 21 U/L (ref 0–37)
Albumin: 4.5 g/dL (ref 3.5–5.2)
Alkaline Phosphatase: 75 U/L (ref 39–117)
BUN: 16 mg/dL (ref 6–23)
CO2: 29 mEq/L (ref 19–32)
Calcium: 9.8 mg/dL (ref 8.4–10.5)
Chloride: 104 mEq/L (ref 96–112)
Creat: 0.63 mg/dL (ref 0.50–1.10)
Glucose, Bld: 92 mg/dL (ref 70–99)
Potassium: 4.4 mEq/L (ref 3.5–5.3)
Sodium: 140 mEq/L (ref 135–145)
Total Bilirubin: 1.5 mg/dL — ABNORMAL HIGH (ref 0.2–1.2)
Total Protein: 7.1 g/dL (ref 6.0–8.3)

## 2014-06-15 LAB — TSH: TSH: 0.821 u[IU]/mL (ref 0.350–4.500)

## 2014-06-15 MED ORDER — SIMVASTATIN 20 MG PO TABS: 20.0000 mg | ORAL_TABLET | Freq: Every day | ORAL | Status: DC

## 2014-06-15 MED ORDER — LEVOTHYROXINE SODIUM 100 MCG PO TABS: 100.0000 ug | ORAL_TABLET | Freq: Every day | ORAL | Status: DC

## 2014-06-15 NOTE — Patient Instructions (Signed)

## 2014-06-15 NOTE — Progress Notes (Signed)
   Subjective:    Patient ID: Dana Duffy, female    DOB: 1946/02/14, 68 y.o.   MRN: [id number]  HPI Patient here for CPE.    Patient does yoga. Teaches and creates art.  Patient due for eye check (corrective lenses) Patient up to date on colonoscopy, Tdap, mammogram.  No further palpitations.  Last December patient was referred to cardiology and had negative monitor.  Palpitations subsequently attributed to a course of prednisone.  Patient has had lifelong heat intolerance. Patient has h/o brown recluse spider bite 2 years ago. Review of Systems  Constitutional: Negative.   HENT: Negative.   Eyes: Positive for itching.  Respiratory: Negative.   Cardiovascular: Negative.   Gastrointestinal: Negative.   Endocrine: Positive for heat intolerance.  Genitourinary: Negative.   Musculoskeletal: Positive for arthralgias, back pain and neck stiffness.  Skin: Negative.   Allergic/Immunologic: Positive for environmental allergies.  Neurological: Negative.   Hematological: Negative.   Psychiatric/Behavioral: Negative.        Objective:   Physical Exam Alert, articulate, NAD, weight appropriate Head:  Normal cephalic Eyes:  Wearing corrective lenses Ears:  Normal hearing Oropharynx: good dentition Neck: supple, no bruit, no thyromegaly Chest: clear Heart: reg, no murmur Abdomen: soft, nontender Breast exam:  No mass Ext:  Hammer toe left 2nd toe, good pulses, no edema     Assessment & Plan:  Annual physical exam - Plan: Comprehensive metabolic panel  Unspecified hypothyroidism - Plan: Vit D  25 hydroxy (rtn osteoporosis monitoring), TSH  Other and unspecified hyperlipidemia - Plan: Vit D  25 hydroxy (rtn osteoporosis monitoring), Lipid panel  Elevated blood pressure reading without diagnosis of hypertension - Plan: CBC, POCT urinalysis dipstick  Hammer toe, left - Plan: Ambulatory referral to Sports Medicine  Signed, Robyn Haber, MD

## 2014-06-16 LAB — VITAMIN D 25 HYDROXY (VIT D DEFICIENCY, FRACTURES): Vit D, 25-Hydroxy: 38 ng/mL (ref 30–89)

## 2014-06-18 ENCOUNTER — Encounter: Payer: Self-pay | Admitting: Sports Medicine

## 2014-06-18 ENCOUNTER — Ambulatory Visit (INDEPENDENT_AMBULATORY_CARE_PROVIDER_SITE_OTHER): Payer: BC Managed Care – PPO | Admitting: Sports Medicine

## 2014-06-18 VITALS — BP 161/73 | Ht 66.0 in | Wt 163.0 lb

## 2014-06-18 DIAGNOSIS — M2040 Other hammer toe(s) (acquired), unspecified foot: Secondary | ICD-10-CM

## 2014-06-18 DIAGNOSIS — M2042 Other hammer toe(s) (acquired), left foot: Secondary | ICD-10-CM | POA: Insufficient documentation

## 2014-06-18 NOTE — Assessment & Plan Note (Signed)
Patient has a hammertoe deformity of the second left toe. This has been causing a lot of irritation with rubbing on the medial and lateral aspects of her 1st and third toe. Patient was provided with gel toe separators to help with cushioning. She was also provided with a metatarsal pad to help with supportive pressure on this second metatarsal phalangeal joint hyperextension. Advised patient to followup if needed or desires fitting for custom orthotics.

## 2014-06-18 NOTE — Progress Notes (Signed)
  Dana Duffy - 68 y.o. female MRN [id number]  Date of birth: May 16, 1946  SUBJECTIVE:  Including CC & ROS.  Patient's 68 year old female who presents today complaining of persistent discomfort from a left foot hammertoe. Patient reports that she had a left foot great toe bunion surgery approximately 70 years ago and ever since that time she's developed a hammering of her second toe with metatarsal pressure pad forming on the plantar aspect. Patient describes this discomfort and irritation with rubbing of the second hammertoe on her first and 3rd toes. particularly worse with wearing shoes such as tennis shoes. She mostly wear sandals. She also has had a rigid orthotic with arch support that she is aware for years that has provided good foot support but no improvement in the padding of her hammertoe.   ROS: Review of systems otherwise negative except for information present in HPI  HISTORY: Past Medical, Surgical, Social, and Family History Reviewed & Updated per EMR. Pertinent Historical Findings include: HLD an hypothyroidism   PHYSICAL EXAM:  VS: BP:161/73 mmHg  HR: bpm  TEMP: ( )  RESP:   HT:5\' 6"  (167.6 cm)   WT:163 lb (73.936 kg)  BMI:26.4 PHYSICAL EXAM: FOOT/ ANKLE EXAM:  General: well nourished Skin of LE: warm; dry, no rashes, lesions, ecchymosis or erythema. Vascular: radial pulses 2+ bilaterally Neurologically: Sensation to light touch lower extremities equal and intact bilaterally.  Foot/Ankle Exam:  Observation - no ecchymosis, no edema, or hematoma present.  Metatarsal-phalangeal joint (MTP) mild hyperextension Interphalangeal joint (ITP) flexion deformity  Palpation: Tenderness with a calcified pressure-point at the metatarsal phalangeal joint ROM: Normal ankle motion and normal motion of the phalanx/ metatarsals Muscle strength: Normal strength ankle and phalanx extension/flexion 5/5 strength bilaterally.   Normal weight bearing arch Mild collapse of the medial  arch.  ASSESSMENT & PLAN: See problem based charting & AVS for pt instructions.

## 2014-11-12 ENCOUNTER — Ambulatory Visit (INDEPENDENT_AMBULATORY_CARE_PROVIDER_SITE_OTHER): Payer: Medicare Other | Admitting: Family Medicine

## 2014-11-12 ENCOUNTER — Ambulatory Visit (INDEPENDENT_AMBULATORY_CARE_PROVIDER_SITE_OTHER): Payer: Medicare Other

## 2014-11-12 ENCOUNTER — Ambulatory Visit (HOSPITAL_COMMUNITY)
Admission: RE | Admit: 2014-11-12 | Discharge: 2014-11-12 | Disposition: A | Discharge status: Home / Self Care | Payer: Medicare Other | Source: Ambulatory Visit | Attending: Family Medicine | Admitting: Family Medicine

## 2014-11-12 ENCOUNTER — Other Ambulatory Visit: Payer: Self-pay | Admitting: Family Medicine

## 2014-11-12 VITALS — BP 144/84 | HR 73 | Temp 98.2°F | Resp 18 | Ht 65.5 in | Wt 162.6 lb

## 2014-11-12 DIAGNOSIS — R159 Full incontinence of feces: Secondary | ICD-10-CM

## 2014-11-12 DIAGNOSIS — M5431 Sciatica, right side: Secondary | ICD-10-CM

## 2014-11-12 DIAGNOSIS — K6289 Other specified diseases of anus and rectum: Secondary | ICD-10-CM

## 2014-11-12 DIAGNOSIS — M25551 Pain in right hip: Secondary | ICD-10-CM | POA: Insufficient documentation

## 2014-11-12 DIAGNOSIS — I10 Essential (primary) hypertension: Secondary | ICD-10-CM

## 2014-11-12 DIAGNOSIS — M5136 Other intervertebral disc degeneration, lumbar region: Secondary | ICD-10-CM

## 2014-11-12 LAB — BASIC METABOLIC PANEL
BUN: 14 mg/dL (ref 6–23)
CO2: 27 mEq/L (ref 19–32)
CREATININE: 0.61 mg/dL (ref 0.50–1.10)
Calcium: 9.7 mg/dL (ref 8.4–10.5)
Chloride: 103 mEq/L (ref 96–112)
Glucose, Bld: 90 mg/dL (ref 70–99)
Potassium: 4.6 mEq/L (ref 3.5–5.3)
Sodium: 140 mEq/L (ref 135–145)

## 2014-11-12 MED ORDER — MELOXICAM 7.5 MG PO TABS: 7.5000 mg | ORAL_TABLET | Freq: Every day | ORAL | Status: DC | PRN

## 2014-11-12 NOTE — Progress Notes (Addendum)
Subjective:  This chart was scribed for Dana Ray, Md by Randa Evens, ED Scribe. This Patient was seen in room 05 and the patients care was started at 1:19 PM   Patient ID: Dana Duffy, female    DOB: [birth year], 69 y.o.   MRN: [id number]  Chief Complaint  Patient presents with  . Hip Pain    Rt Hip pain x 2 months it got worse in the last 2 days -[facility]-    HPI HPI Comments: Dana Duffy is a 69 y.o. female who presents to the [facility] complaining of right hip pain onset 2 months prior. Pt states that the pain recently worsened over the past 2 days. Pt states she has had some associated intermittent right leg weakness and back pain. Pt states that the pain was initially intermittent when going up steps or down hills but now has become more constant. Pt states that the pain is shooting down into her right leg as well to the foot. Pt states she also had tingling described as pins and needles in her right toes last night. Pt denies any injury to the hip. Pt denies any new activities. Pt states she first noticed slight pain in her knee in September in [place] when walking up and down the steps to take the metro. Pt states she has a hammer toe on her left foot but states that she feels as if her abnormal gait is making her right hip pain worse. Pt states she has a Hx of intermittent bowel incontinence, not necessarily associated with back pain.  Pt states she has had some recurrent intermittent bowel incontinence for the past week. Pt states that her incontinence was worse last night around 8 PM. Pt denies numbness or weakness in genital  area. Pt denies bladder incontinence.  Pt states she has Hx of incontinence after the birth of her son.    Elevated BP. 150/76 in may of last year. 160/90 at her physical in august. 161/73 at sports medicine last august. 180/98 today.   Patient Active Problem List   Diagnosis Date Noted  . Hammer toe of left foot 06/18/2014  . BMI  27.0-27.9,adult 06/12/2012  . Hypothyroidism 06/12/2012  . Hyperlipidemia 06/12/2012   Past Medical History  Diagnosis Date  . Allergy   . Arthritis   . Anemia   . Thyroid disease    Past Surgical History  Procedure Laterality Date  . Bunionectomy     Allergies  Allergen Reactions  . Sulfa Antibiotics Other (See Comments)    SKIN REDNESS   Prior to Admission medications   Medication Sig Start Date End Date Taking? Authorizing Provider  acidophilus (RISAQUAD) CAPS Take by mouth daily.   Yes Historical Provider, MD  Calcium Carbonate (CALCIUM 600 PO) Take by mouth daily.   Yes Historical Provider, MD  Cholecalciferol (VITAMIN D) 1000 UNITS capsule Take 1,000 Units by mouth daily.   Yes Historical Provider, MD  DiphenhydrAMINE HCl (BENADRYL ALLERGY PO) Take by mouth.   Yes Historical Provider, MD  levothyroxine (SYNTHROID) 100 MCG tablet Take 1 tablet (100 mcg total) by mouth daily. 06/15/14  Yes Robyn Haber, MD  Psyllium (METAMUCIL PO) Take by mouth.   Yes Historical Provider, MD  simvastatin (ZOCOR) 20 MG tablet Take 1 tablet (20 mg total) by mouth at bedtime. 06/15/14  Yes Robyn Haber, MD   History   Social History  . Marital Status: Divorced    Spouse Name: N/A    Number  of Children: N/A  . Years of Education: N/A   Occupational History  . Artist    Social History Main Topics  . Smoking status: Never Smoker   . Smokeless tobacco: Not on file  . Alcohol Use: 1.2 oz/week    2 Glasses of wine per week     Comment: occas  . Drug Use: No  . Sexual Activity: Not on file   Other Topics Concern  . Not on file   Social History Narrative   5 pregnancies   2 living children   2 miscarriages   Divorced.           Review of Systems  Constitutional: Negative for fatigue and unexpected weight change.  Respiratory: Negative for chest tightness and shortness of breath.   Cardiovascular: Negative for chest pain, palpitations and leg swelling.  Gastrointestinal:  Negative for abdominal pain and blood in stool.  Musculoskeletal: Positive for back pain, arthralgias and gait problem.  Neurological: Positive for weakness. Negative for dizziness, syncope, light-headedness and headaches.     Objective:   BP 180/98 mmHg  Pulse 73  Temp(Src) 98.2 F (36.8 C) (Oral)  Resp 18  Ht 5' 5.5" (1.664 m)  Wt 162 lb 9.6 oz (73.755 kg)  BMI 26.64 kg/m2  SpO2 98%   Physical Exam  Constitutional: She is oriented to person, place, and time. She appears well-developed and well-nourished.  HENT:  Head: Normocephalic and atraumatic.  Eyes: Conjunctivae and EOM are normal. Pupils are equal, round, and reactive to light.  Neck: Carotid bruit is not present.  Cardiovascular: Normal rate, regular rhythm, normal heart sounds and intact distal pulses.   Pulmonary/Chest: Effort normal and breath sounds normal.  Abdominal: Soft. She exhibits no pulsatile midline mass. There is no tenderness.  Genitourinary:  sensation intact around anus and somewhat decreased rectal tone. Chaperone Present.   Musculoskeletal:  Able to heal and toe walk. Back: full ROM, with left lateral flexion pain on right hip and back, tender along sciatic notch on right, no focal back tenderness  Neurological: She is alert and oriented to person, place, and time. She displays no Babinski's sign on the right side. She displays no Babinski's sign on the left side.  Reflex Scores:      Patellar reflexes are 2+ on the right side and 2+ on the left side.      Achilles reflexes are 2+ on the right side and 2+ on the left side. Negative SLR bilaterally.    Skin: Skin is warm and dry.  Psychiatric: She has a normal mood and affect. Her behavior is normal.  Vitals reviewed.  [facility] reading (PRIMARY) by  Dr. Carlota Raspberry. Lumbar spine: Marked scoliosis of lumbar spine with left shift. Diffuse degenerative disease with minimal disc space diffusely. Pelvis: Few degenerative changes without fracture.      Assessment & Plan:  Dana Duffy is a 69 y.o. female Right sided sciatica - Plan: DG Lumbar Spine Complete, DG HIP UNILAT WITH PELVIS 1V RIGHT, MR Lumbar Spine Wo Contrast  Essential hypertension - Plan: Basic metabolic panel, DG Lumbar Spine Complete  Stool incontinence - Plan: DG HIP UNILAT WITH PELVIS 1V RIGHT, MR Lumbar Spine Wo Contrast  Decreased rectal sphincter tone - Plan: MR Lumbar Spine Wo Contrast  DDD (degenerative disc disease), lumbar - Plan: MR Lumbar Spine Wo Contrast  Marked degenerative disease of back, with suspected sciatica source of lumbar spine. Some decreased rectal tone and recent worsening of stool incontinence concerning for cauda equina  syndrome, but reflexes and strength intact on exam. MRI ordered today. Consider trial of prednisone if no acute findings, hydrocodone Rx if necessary for pain.   HTN - multiple prior elevated readings, but repeat reading ok.  Check outside BP's, BMP, and recheck in next few weeks. Sooner if worse.    No orders of the defined types were placed in this encounter.      Patient Instructions  We will have MRI checked today to see if urgent surgeon eval is necessary. Depending on results - may call in prednisone and follow up in next week. Hydrocodone if needed for pain. Return to the clinic or go to the nearest emergency room if any of your symptoms worsen or new symptoms occur.       I personally performed the services described in this documentation, which was scribed in my presence. The recorded information has been reviewed and considered, and addended by me as needed.

## 2014-11-12 NOTE — Patient Instructions (Addendum)
We will have MRI checked today to see if urgent surgeon eval is necessary. Depending on results - may call in prednisone and follow up in next week. Tylenol ok if needed for pain, but let me know if stronger med needed.   Return to the clinic or go to the nearest emergency room if any of your symptoms worsen or new symptoms occur.   Your repeat blood pressure was better. Keep a record of your blood pressures outside of the office and bring them to the next office visit.Return to the clinic or go to the nearest emergency room if any of your symptoms worsen or new symptoms occur.

## 2014-11-24 ENCOUNTER — Ambulatory Visit (INDEPENDENT_AMBULATORY_CARE_PROVIDER_SITE_OTHER): Payer: Medicare Other | Admitting: Internal Medicine

## 2014-11-24 VITALS — BP 185/83 | HR 62 | Temp 97.5°F | Resp 16 | Ht 65.5 in | Wt 162.0 lb

## 2014-11-24 DIAGNOSIS — I10 Essential (primary) hypertension: Secondary | ICD-10-CM

## 2014-11-24 DIAGNOSIS — M5136 Other intervertebral disc degeneration, lumbar region: Secondary | ICD-10-CM

## 2014-11-24 DIAGNOSIS — M5410 Radiculopathy, site unspecified: Secondary | ICD-10-CM

## 2014-11-24 MED ORDER — PREDNISONE 20 MG PO TABS: ORAL_TABLET | ORAL | Status: DC

## 2014-11-24 MED ORDER — HYDROCHLOROTHIAZIDE 12.5 MG PO CAPS: 12.5000 mg | ORAL_CAPSULE | Freq: Every day | ORAL | Status: DC

## 2014-11-24 NOTE — Progress Notes (Signed)
Subjective:  This chart was scribed for Ellamae Sia, MD by Richarda Overlie, Medical scribe. This patient was seen in ROOM 2 and the patient's care was started 7:24 PM.   Patient ID: Dana Duffy, female    DOB: 03-28-1946, 69 y.o.   MRN: [id number]   Chief Complaint  Patient presents with  . Follow-up    Hypertension  . Hypertension   HPI HPI Comments: Dana Duffy is a 69 y.o. female with a history of HTN, arthritis and pinched lumbar nerve/DDD w/curve, who presents to [facility] complaining of intermittent right hip pain that started about 2 months ago. She reports that her pain radiates down towards her right knee. Pt was seen at [facility] 12 days ago and is here for a follow up.(right hip x-ray, lumbar x-ray and  MRI). She was prescribed meloxicam and says that the medication has helped her pain, but very slightly. Pt reports that she has been doing yoga which she thinks has helped her symptoms. She states that certain positions and extended walking worsens her right hip pain.  Xray=Scoliosis and extensive generalized osteoarthritic change. There is vacuum phenomenon at L5-S1. No fracture or spondylolisthesis apparent. Apparent gallstones right upper quadrant  MRI=IMPRESSION: 1. Prominent edema throughout the L1, L2, L5, and S1 vertebral bodies. This is favored to be secondary to advanced degenerative disc disease rather than infection (unless there is high clinical suspicion for discitis/osteomyelitis), with superimposed potentially acute superior endplate compression fractures/Schmorl's node deformities at L1 and L2. 2. Mild, chronic T12 compression fracture. 3. Lumbar levoscoliosis and advanced, diffuse disc degeneration. No spinal stenosis. Mild multilevel right lateral recess and right neural foraminal stenosis as above. Moderate left neural foraminal stenosis at L4-5 and L5-S1.  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 No gait abnormalities. She is beginning to have more difficulty with her yoga postures.   Pt is also concerned of elevated blood pressure recently. She says that she thinks this could possibly be attributed to her son who she is worried about. He has a lot of stress-related problems stemming from his work for his father, her ex-husband's company. No chest pain or palpitations. No peripheral edema. No dyspnea on exertion Review of chart shows systolic hypertension on several occasions in the last 2 years  Patient Active Problem List   Diagnosis Date Noted  . Hammer toe of left foot 06/18/2014  . BMI 27.0-27.9,adult 06/12/2012  . Hypothyroidism 06/12/2012  . Hyperlipidemia 06/12/2012    Current Outpatient Prescriptions on File Prior to Visit  Medication Sig Dispense Refill  . acidophilus (RISAQUAD) CAPS Take by mouth daily.    . Calcium Carbonate (CALCIUM 600 PO) Take by mouth daily.    . Cholecalciferol (VITAMIN D) 1000 UNITS capsule Take 1,000 Units by mouth daily.    . DiphenhydrAMINE HCl (BENADRYL ALLERGY PO) Take by mouth.    . levothyroxine (SYNTHROID) 100 MCG tablet Take 1 tablet (100 mcg total) by mouth daily. 90 tablet 3  . meloxicam (MOBIC) 7.5 MG tablet Take 1 tablet (7.5 mg total) by mouth daily as needed for pain. 30 tablet 0  . Psyllium (METAMUCIL PO) Take by mouth.    . simvastatin (ZOCOR) 20 MG tablet Take 1 tablet (20 mg total) by mouth at bedtime. 90 tablet 3   No current facility-administered medications on file  prior to visit.   Allergies  Allergen Reactions  . Sulfa Antibiotics Other (See Comments)    SKIN REDNESS   Past Surgical History  Procedure Laterality Date  . Bunionectomy     Family History  Problem Relation Age of Onset  . Heart disease Mother   . Hyperlipidemia Mother   . Hypertension Mother   . Asthma Son   . Depression Maternal  Grandmother   . Hyperlipidemia Maternal Grandmother   . Diabetes Father   . Cancer Sister     Review of Systems  Musculoskeletal: Positive for myalgias and arthralgias.  Neurological: Positive for weakness.   otherwise noncontributory    Objective:   Physical Exam  Constitutional: She is oriented to person, place, and time. She appears well-developed and well-nourished. No distress.  HENT:  Head: Normocephalic and atraumatic.  Eyes: Conjunctivae and EOM are normal. Pupils are equal, round, and reactive to light.  Neck: Normal range of motion. Neck supple. No thyromegaly present.  Cardiovascular: Normal rate, regular rhythm, normal heart sounds and intact distal pulses.   No murmur heard. No bruits  Pulmonary/Chest: Effort normal.  Musculoskeletal: She exhibits tenderness.  Straight leg raise on the right to 90 degress produces radicular symptoms along the lateral thigh. External rotation of the hip has decreased ROM with slight discomfort in the hip. There is reduced range of motion externally. TTP over the gluteal area on the right. Not specifically in the SI joint. Greater trochanter nontender. There is  muscle atrophy in the right gastroc compared to the left  Lymphadenopathy:    She has no cervical adenopathy.  Neurological: She is alert and oriented to person, place, and time. No cranial nerve deficit.  Psychiatric: She has a normal mood and affect. Her behavior is normal.  Nursing note and vitals reviewed.   Filed Vitals:   11/24/14 1903  BP: 185/83  Pulse: 62  Temp: 97.5 F (36.4 C)  TempSrc: Oral  Resp: 16  Height: 5' 5.5" (1.664 m)  Weight: 162 lb (73.483 kg)  SpO2: 100%   Wt Readings from Last 3 Encounters:  11/24/14 162 lb (73.483 kg)  11/12/14 162 lb 9.6 oz (73.755 kg)  06/18/14 163 lb (73.936 kg)       Assessment & Plan:  I have completed the patient encounter in its entirety as documented by the scribe, with editing by me where necessary. Dana Duffy P.  Merla Riches, M.D.  DDD (degenerative disc disease), lumbar - Plan: Ambulatory referral to Orthopedic Surgery  Radicular pain of right lower extremity - Plan: Ambulatory referral to Orthopedic Surgery  Essential hypertension  Meds ordered this encounter  Medications  . predniSONE (DELTASONE) 20 MG tablet    Sig: 3/3/2/2/1/1 single daily dose for 6 days    Dispense:  12 tablet    Refill:  0  . hydrochlorothiazide (MICROZIDE) 12.5 MG capsule    Sig: Take 1 capsule (12.5 mg total) by mouth daily.    Dispense:  90 capsule    Refill:  1   Referral to Dr. Yevette Edwards Follow-up blood pressure with home  blood pressure readings reported to me over the next 2-4 weeks

## 2014-12-02 ENCOUNTER — Encounter: Payer: Self-pay | Admitting: Internal Medicine

## 2015-03-03 ENCOUNTER — Encounter: Payer: Self-pay | Admitting: Internal Medicine

## 2015-03-03 ENCOUNTER — Ambulatory Visit (INDEPENDENT_AMBULATORY_CARE_PROVIDER_SITE_OTHER): Payer: Medicare Other | Admitting: Internal Medicine

## 2015-03-03 VITALS — BP 161/82 | HR 62 | Temp 98.6°F | Resp 16 | Ht 65.5 in | Wt 158.6 lb

## 2015-03-03 DIAGNOSIS — E039 Hypothyroidism, unspecified: Secondary | ICD-10-CM | POA: Diagnosis not present

## 2015-03-03 DIAGNOSIS — E785 Hyperlipidemia, unspecified: Secondary | ICD-10-CM | POA: Diagnosis not present

## 2015-03-03 DIAGNOSIS — Z0000 Encounter for general adult medical examination without abnormal findings: Secondary | ICD-10-CM | POA: Diagnosis not present

## 2015-03-03 DIAGNOSIS — Z6827 Body mass index (BMI) 27.0-27.9, adult: Secondary | ICD-10-CM

## 2015-03-03 LAB — CBC WITH DIFFERENTIAL/PLATELET
BASOS ABS: 0 10*3/uL (ref 0.0–0.1)
BASOS PCT: 0 % (ref 0–1)
Eosinophils Absolute: 0.1 10*3/uL (ref 0.0–0.7)
Eosinophils Relative: 2 % (ref 0–5)
HCT: 40.8 % (ref 36.0–46.0)
HEMOGLOBIN: 13.6 g/dL (ref 12.0–15.0)
Lymphocytes Relative: 34 % (ref 12–46)
Lymphs Abs: 1.6 10*3/uL (ref 0.7–4.0)
MCH: 30.9 pg (ref 26.0–34.0)
MCHC: 33.3 g/dL (ref 30.0–36.0)
MCV: 92.7 fL (ref 78.0–100.0)
MPV: 11.3 fL (ref 8.6–12.4)
Monocytes Absolute: 0.4 10*3/uL (ref 0.1–1.0)
Monocytes Relative: 9 % (ref 3–12)
NEUTROS PCT: 55 % (ref 43–77)
Neutro Abs: 2.5 10*3/uL (ref 1.7–7.7)
PLATELETS: 185 10*3/uL (ref 150–400)
RBC: 4.4 MIL/uL (ref 3.87–5.11)
RDW: 13.8 % (ref 11.5–15.5)
WBC: 4.6 10*3/uL (ref 4.0–10.5)

## 2015-03-03 LAB — T4, FREE: Free T4: 1.17 ng/dL (ref 0.80–1.80)

## 2015-03-03 LAB — TSH: TSH: 1.359 u[IU]/mL (ref 0.350–4.500)

## 2015-03-03 MED ORDER — BD ASSURE BPM/AUTO ARM CUFF MISC: Status: DC

## 2015-03-03 NOTE — Progress Notes (Signed)
Subjective:    Patient ID: Dana Duffy, female    DOB: 02/03/1946, 69 y.o.   MRN: [id number]  HPI  69 year old here for annual exam Patient Active Problem List   Diagnosis Date Noted  . Hammer toe of left foot 06/18/2014  . BMI 27.0-27.9,adult 06/12/2012  . Hypothyroidism 06/12/2012  . Hyperlipidemia 06/12/2012    -  Chr Back Pain/see recent visits ---yoga plus massage([facility]) doing great   -  Hypertension? She was started on hydrochlorothiazide recently and blood pressures at home have been between 80 and 90 systolic with episodes of postural dizziness and excessive fatigue. She has a long history of elevated blood pressures at a medical office and as discovered these low blood pressures at home only after bottle borrowing her sister's blood pressure cuff.  Prior to Admission medications   Medication Sig Start Date End Date Taking? Authorizing Provider  acidophilus (RISAQUAD) CAPS Take by mouth daily.   Yes Historical Provider, MD  Calcium Carbonate (CALCIUM 600 PO) Take by mouth daily.   Yes Historical Provider, MD  Cholecalciferol (VITAMIN D) 1000 UNITS capsule Take 1,000 Units by mouth daily.   Yes Historical Provider, MD  DiphenhydrAMINE HCl (BENADRYL ALLERGY PO) Take by mouth.   Yes Historical Provider, MD  hydrochlorothiazide (MICROZIDE) 12.5 MG capsule Take 1 capsule (12.5 mg total) by mouth daily. 11/24/14  Yes Leandrew Koyanagi, MD  levothyroxine (SYNTHROID) 100 MCG tablet Take 1 tablet (100 mcg total) by mouth daily. 06/15/14  Yes Robyn Haber, MD  Psyllium (METAMUCIL PO) Take by mouth.   Yes Historical Provider, MD  simvastatin (ZOCOR) 20 MG tablet Take 1 tablet (20 mg total) by mouth at bedtime. 06/15/14  Yes Robyn Haber, MD  meloxicam (MOBIC) 7.5 MG tablet Take 1 tablet (7.5 mg total) by mouth daily as needed for pain. 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 Immunizations up-to-date health maintenance issues up-to-date  Review of Systems  Constitutional: Positive for diaphoresis and fatigue.       Sweating only at night-- Fatigue after starting hct ---gone when stopped  HENT: Negative.   Eyes: Negative.   Respiratory: Negative.   Cardiovascular: Negative.   Gastrointestinal: Negative.   Endocrine: Negative.   Genitourinary: Negative.   Musculoskeletal: Negative.   Skin: Negative.   Allergic/Immunologic: Negative.   Neurological: Negative.   Hematological: Negative.   Psychiatric/Behavioral: Negative.    Fiber chews qod    Objective:   Physical Exam  Constitutional: She is oriented to person, place, and time. She appears well-developed and well-nourished. No distress.  HENT:  Head: Normocephalic.  Right Ear: External ear normal.  Left Ear: External ear normal.  Nose: Nose normal.  Mouth/Throat: Oropharynx is clear and moist.  Eyes: Conjunctivae and EOM are normal. Pupils are equal, round, and reactive to light.  Neck: Normal range of motion. Neck supple. No thyromegaly present.  Cardiovascular: Normal rate, regular rhythm,  normal heart sounds and intact distal pulses.   No murmur heard. Pulmonary/Chest: Effort normal and breath sounds normal. She has no wheezes.  Abdominal: Soft. Bowel sounds are normal. She exhibits no distension and no mass. There is no tenderness. There is no rebound.  Musculoskeletal: Normal range of  motion. She exhibits no edema or tenderness.  Lymphadenopathy:    She has no cervical adenopathy.  Neurological: She is alert and oriented to person, place, and time. She has normal reflexes. No cranial nerve deficit.  Skin: Skin is warm and dry. No rash noted.  Psychiatric: She has a normal mood and affect. Her behavior is normal. Judgment and thought content normal.  Nursing note and vitals reviewed.  BP 161/82 mmHg  Pulse 62  Temp(Src) 98.6 F (37 C) (Oral)  Resp 16  Ht 5' 5.5" (1.664 m)  Wt 158 lb 9.6 oz (71.94 kg)  BMI 25.98 kg/m2  SpO2 99% Once again her intraoffice blood pressures out of portion to home measurements       Assessment & Plan:  Annual exam --- Medicare subsequent follow-up visit  BMI 27.0-27.9,adult - Plan: Comprehensive metabolic panel///continue exercise and weight loss  Hypothyroidism, unspecified hypothyroidism type - Plan: TSH, T4, free  Hyperlipidemia - Plan: Lipid panel  ? Hypertension Refill meds after labs --has til 8/16 anyway Home bp cuff--monitor blood pressures off medications and let me know results in 1-2 months  Chronic back pain-continue exercise with yoga and besides   Addendum labs Results for orders placed or performed in visit on 69/62/95  Basic metabolic panel  Result Value Ref Range   Sodium 140 135 - 145 mEq/L   Potassium 4.6 3.5 - 5.3 mEq/L   Chloride 103 96 - 112 mEq/L   CO2 27 19 - 32 mEq/L   Glucose, Bld 90 70 - 99 mg/dL   BUN 14 6 - 23 mg/dL   Creat 0.61 0.50 - 1.10 mg/dL   Calcium 9.7 8.4 - 10.5 mg/dL   rest pending for now

## 2015-03-04 LAB — COMPREHENSIVE METABOLIC PANEL
ALK PHOS: 55 U/L (ref 39–117)
ALT: 13 U/L (ref 0–35)
AST: 19 U/L (ref 0–37)
Albumin: 3.6 g/dL (ref 3.5–5.2)
BUN: 13 mg/dL (ref 6–23)
CHLORIDE: 105 meq/L (ref 96–112)
CO2: 29 meq/L (ref 19–32)
CREATININE: 0.6 mg/dL (ref 0.50–1.10)
Calcium: 9 mg/dL (ref 8.4–10.5)
Glucose, Bld: 83 mg/dL (ref 70–99)
Potassium: 4.2 mEq/L (ref 3.5–5.3)
Sodium: 140 mEq/L (ref 135–145)
Total Bilirubin: 0.9 mg/dL (ref 0.2–1.2)
Total Protein: 6 g/dL (ref 6.0–8.3)

## 2015-03-04 LAB — LIPID PANEL
CHOLESTEROL: 176 mg/dL (ref 0–200)
HDL: 81 mg/dL (ref >=46)
LDL Cholesterol: 84 mg/dL (ref 0–99)
Total CHOL/HDL Ratio: 2.2 Ratio
Triglycerides: 56 mg/dL (ref <150)
VLDL: 11 mg/dL (ref 0–40)

## 2015-03-04 MED ORDER — LEVOTHYROXINE SODIUM 100 MCG PO TABS: 100.0000 ug | ORAL_TABLET | Freq: Every day | ORAL | Status: DC

## 2015-03-04 MED ORDER — SIMVASTATIN 20 MG PO TABS: 20.0000 mg | ORAL_TABLET | Freq: Every day | ORAL | Status: DC

## 2015-03-04 NOTE — Addendum Note (Signed)
Addended by: Leandrew Koyanagi on: 03/04/2015 11:21 AM   Modules accepted: Orders

## 2015-07-05 ENCOUNTER — Other Ambulatory Visit: Payer: Self-pay | Admitting: Family Medicine

## 2015-09-20 ENCOUNTER — Encounter: Payer: Self-pay | Admitting: Internal Medicine

## 2015-10-15 ENCOUNTER — Ambulatory Visit (INDEPENDENT_AMBULATORY_CARE_PROVIDER_SITE_OTHER): Payer: Medicare Other | Admitting: Family Medicine

## 2015-10-15 ENCOUNTER — Ambulatory Visit (INDEPENDENT_AMBULATORY_CARE_PROVIDER_SITE_OTHER): Payer: Medicare Other

## 2015-10-15 VITALS — BP 140/90 | HR 77 | Temp 98.1°F | Resp 17 | Ht 65.0 in | Wt 165.2 lb

## 2015-10-15 DIAGNOSIS — R142 Eructation: Secondary | ICD-10-CM | POA: Diagnosis not present

## 2015-10-15 DIAGNOSIS — R0782 Intercostal pain: Secondary | ICD-10-CM

## 2015-10-15 DIAGNOSIS — R110 Nausea: Secondary | ICD-10-CM | POA: Diagnosis not present

## 2015-10-15 NOTE — Progress Notes (Signed)
By signing my name below, I, Moises Blood, attest that this documentation has been prepared under the direction and in the presence of Robyn Haber, MD. Electronically Signed: Moises Blood, [place]. 10/15/2015 , 4:22 PM .  Patient was seen in room 9 .   Patient ID: Dana Duffy MRN: [id number], DOB: [birth year], 69 y.o. Date of Encounter: 10/15/2015  Primary Physician: Leandrew Koyanagi, MD  Chief Complaint:  Chief Complaint  Patient presents with  . Back Pain    Upper left side, hurts turn left side and walking   . Nausea    HPI:  Dana Duffy is a 69 y.o. female who presents to [facility] complaining of left back pain after coughing from previous illness. She states she was taking amoxicillin for sinus infection. While taking it, she felt nauseous and noticed some upper left back pain. She finished her amoxicillin 2 days ago. Her nausea has improved. She also noticed increased frequency in urination and burping more than usual. She has decreased ROM rotating to the left. She denies vomiting, cramping abd pain and diarrhea.   She goes to massage therapist Haywood Filler) to help her back.   She plans to travel to [place] for the holidays visiting her son.   Past Medical History  Diagnosis Date  . Allergy   . Arthritis   . Anemia   . Thyroid disease      Home Meds: Prior to Admission medications   Medication Sig Start Date End Date Taking? Authorizing Provider  acidophilus (RISAQUAD) CAPS Take by mouth daily.   Yes Historical Provider, MD  Blood Pressure Monitoring (B-D ASSURE BPM/AUTO ARM CUFF) MISC Dx elevated BP--unsure if HTN 03/03/15  Yes Leandrew Koyanagi, MD  Cholecalciferol (VITAMIN D) 1000 UNITS capsule Take 1,000 Units by mouth daily.   Yes Historical Provider, MD  DiphenhydrAMINE HCl (BENADRYL ALLERGY PO) Take by mouth.   Yes Historical Provider, MD  levothyroxine (SYNTHROID) 100 MCG tablet Take 1 tablet (100 mcg total) by mouth  daily. 03/04/15  Yes Leandrew Koyanagi, MD  simvastatin (ZOCOR) 20 MG tablet Take 1 tablet (20 mg total) by mouth at bedtime. 03/04/15  Yes Leandrew Koyanagi, MD  Calcium Carbonate (CALCIUM 600 PO) Take by mouth daily. Reported on 10/15/2015    Historical Provider, MD  Psyllium (METAMUCIL PO) Take by mouth. Reported on 10/15/2015    Historical Provider, MD    Allergies:  Allergies  Allergen Reactions  . Penicillins   . Sulfa Antibiotics Other (See Comments)    SKIN REDNESS    Social History   Social History  . Marital Status: Divorced    Spouse Name: N/A  . Number of Children: N/A  . Years of Education: N/A   Occupational History  . Artist    Social History Main Topics  . Smoking status: Never Smoker   . Smokeless tobacco: Not on file  . Alcohol Use: 1.2 oz/week    2 Glasses of wine per week     Comment: occas  . Drug Use: No  . Sexual Activity: Not on file   Other Topics Concern  . Not on file   Social History Narrative   5 pregnancies   2 living children   2 miscarriages   Divorced.           Review of Systems: Constitutional: negative for fever, chills, night sweats, weight changes, or fatigue  HEENT: negative for vision changes, hearing loss, congestion, rhinorrhea, ST, epistaxis, or sinus  pressure Cardiovascular: negative for chest pain or palpitations Respiratory: negative for hemoptysis, wheezing, shortness of breath, or cough Abdominal: negative for abdominal pain, vomiting, diarrhea, or constipation; positive for nausea, belching Dermatological: negative for rash Neurologic: negative for headache, dizziness, or syncope Musc: positive for back pain GU: positive for increased frequency All other systems reviewed and are otherwise negative with the exception to those above and in the HPI.  Physical Exam: Blood pressure 140/90, pulse 77, temperature 98.1 F (36.7 C), temperature source Oral, resp. rate 17, height 5\' 5"  (1.651 m), weight 165 lb 3.2 oz  (74.934 kg), SpO2 97 %., Body mass index is 27.49 kg/(m^2). General: Well developed, well nourished, in no acute distress. Head: Normocephalic, atraumatic, eyes without discharge, sclera non-icteric, nares are without discharge. Bilateral auditory canals clear, TM's are without perforation, pearly grey and translucent with reflective cone of light bilaterally. Oral cavity moist, posterior pharynx without exudate, erythema, peritonsillar abscess, or post nasal drip. Neck: Supple. No thyromegaly. Full ROM. No lymphadenopathy. Lungs: Clear bilaterally to auscultation without wheezes, rales, or rhonchi. Breathing is unlabored. Heart: RRR with S1 S2. No murmurs, rubs, or gallops appreciated. Abdomen: Soft, non-tender, non-distended with normoactive bowel sounds. No hepatomegaly. No rebound/guarding. No obvious abdominal masses. Msk:  Strength and tone normal for age. Full rom of her back Extremities/Skin: Warm and dry. No clubbing or cyanosis. No edema. No rashes or suspicious lesions. Neuro: Alert and oriented X 3. Moves all extremities spontaneously. Gait is normal. CNII-XII grossly in tact. Psych:  Responds to questions appropriately with a normal affect.   [facility] reading (PRIMARY) by Dr. Joseph Art : chest xray: normal   ASSESSMENT AND PLAN:  69 y.o. year old female with chest muscle strain and antibiotic side effect. This chart was scribed in my presence and reviewed by me personally.    ICD-9-CM ICD-10-CM   1. Intercostal pain 786.59 R07.82 DG Chest 2 View  2. Belching 787.3 R14.2   3. Nausea without vomiting 787.02 R11.0     Continue the probiotic.  Consider massage.  Heat and rest for now    Signed, Robyn Haber, MD 10/15/2015 4:22 PM

## 2015-10-15 NOTE — Patient Instructions (Signed)
The chest x-ray is normal so I think the problem is an intercostal muscle strain from coughing  The nausea and belching seems to be resolving and most likely is a secondary effect from the amoxicillin.  Probiotics daily should help resolve this.

## 2015-10-19 ENCOUNTER — Telehealth: Payer: Self-pay

## 2015-10-19 ENCOUNTER — Ambulatory Visit (INDEPENDENT_AMBULATORY_CARE_PROVIDER_SITE_OTHER): Payer: Medicare Other | Admitting: Emergency Medicine

## 2015-10-19 VITALS — BP 181/108 | HR 70 | Temp 98.4°F | Resp 20 | Ht 66.0 in | Wt 162.2 lb

## 2015-10-19 DIAGNOSIS — G8929 Other chronic pain: Secondary | ICD-10-CM

## 2015-10-19 DIAGNOSIS — K2970 Gastritis, unspecified, without bleeding: Secondary | ICD-10-CM | POA: Diagnosis not present

## 2015-10-19 DIAGNOSIS — K2990 Gastroduodenitis, unspecified, without bleeding: Secondary | ICD-10-CM | POA: Diagnosis not present

## 2015-10-19 DIAGNOSIS — R1013 Epigastric pain: Secondary | ICD-10-CM | POA: Diagnosis not present

## 2015-10-19 MED ORDER — SUCRALFATE 1 G PO TABS: ORAL_TABLET | ORAL | Status: DC

## 2015-10-19 MED ORDER — LANSOPRAZOLE 30 MG PO CPDR: 30.0000 mg | DELAYED_RELEASE_CAPSULE | Freq: Every day | ORAL | Status: DC

## 2015-10-19 NOTE — Telephone Encounter (Signed)
Please have patient return for further evaluation

## 2015-10-19 NOTE — Patient Instructions (Signed)

## 2015-10-19 NOTE — Progress Notes (Signed)
Subjective:  Patient ID: Dana Duffy, female    DOB: 1946-05-12  Age: 69 y.o. MRN: [id number]  CC: Follow-up   HPI Dana Duffy presents   H was seen easily after taking an antibiotic course and had dyspepsia in belching and was treated by Dr. Verline Lema. Now she has epigastric pain radiating through to her back at times not affected by food. She is anorectic. She has no nausea vomiting or diarrhea. She has somewhat nauseated. Has no specific food intolerance. She doesn't drink alcohol. She's nonsmoker. She doesn't use excessive amounts of aspirin or nonsteroidal anti-inflammatories. Had no blood mucus or pus or stool. No vomiting blood. Has no symptoms suggestive of reflux  History Dana Duffy has a past medical history of Allergy; Arthritis; Anemia; and Thyroid disease.   She has past surgical history that includes Bunionectomy.   Her  family history includes Asthma in her son; Cancer in her sister; Depression in her maternal grandmother; Diabetes in her father; Heart disease in her mother; Hyperlipidemia in her maternal grandmother and mother; Hypertension in her mother.  She   reports that she has never smoked. She does not have any smokeless tobacco history on file. She reports that she drinks about 1.2 oz of alcohol per week. She reports that she does not use illicit drugs.  Outpatient Prescriptions Prior to Visit  Medication Sig Dispense Refill  . acidophilus (RISAQUAD) CAPS Take by mouth daily.    . Blood Pressure Monitoring (B-D ASSURE BPM/AUTO ARM CUFF) MISC Dx elevated BP--unsure if HTN 1 each 0  . Calcium Carbonate (CALCIUM 600 PO) Take by mouth daily. Reported on 10/15/2015    . Cholecalciferol (VITAMIN D) 1000 UNITS capsule Take 1,000 Units by mouth daily.    . DiphenhydrAMINE HCl (BENADRYL ALLERGY PO) Take by mouth.    . levothyroxine (SYNTHROID) 100 MCG tablet Take 1 tablet (100 mcg total) by mouth daily. 90 tablet 3  . Psyllium (METAMUCIL PO) Take by mouth. Reported on  10/15/2015    . simvastatin (ZOCOR) 20 MG tablet Take 1 tablet (20 mg total) by mouth at bedtime. 90 tablet 3   No facility-administered medications prior to visit.    Social History   Social History  . Marital Status: Divorced    Spouse Name: N/A  . Number of Children: N/A  . Years of Education: N/A   Occupational History  . Artist    Social History Main Topics  . Smoking status: Never Smoker   . Smokeless tobacco: None  . Alcohol Use: 1.2 oz/week    2 Glasses of wine per week     Comment: occas  . Drug Use: No  . Sexual Activity: Not Asked   Other Topics Concern  . None   Social History Narrative   5 pregnancies   2 living children   2 miscarriages   Divorced.           Review of Systems  Constitutional: Negative for fever, chills and appetite change.  HENT: Negative for congestion, ear pain, postnasal drip, sinus pressure and sore throat.   Eyes: Negative for pain and redness.  Respiratory: Negative for cough, shortness of breath and wheezing.   Cardiovascular: Negative for leg swelling.  Gastrointestinal: Positive for nausea and abdominal pain. Negative for vomiting, diarrhea, constipation and blood in stool.  Endocrine: Negative for polyuria.  Genitourinary: Negative for dysuria, urgency, frequency and flank pain.  Musculoskeletal: Negative for gait problem.  Skin: Negative for rash.  Neurological: Negative for weakness and headaches.  Psychiatric/Behavioral: Negative for confusion and decreased concentration. The patient is not nervous/anxious.     Objective:  BP 181/108 mmHg  Pulse 70  Temp(Src) 98.4 F (36.9 C) (Oral)  Resp 20  Ht 5\' 6"  (1.676 m)  Wt 162 lb 3.2 oz (73.573 kg)  BMI 26.19 kg/m2  SpO2 97%  Physical Exam  Constitutional: She is oriented to person, place, and time. She appears well-developed and well-nourished. No distress.  HENT:  Head: Normocephalic and atraumatic.  Right Ear: External ear normal.  Left Ear: External ear  normal.  Nose: Nose normal.  Eyes: Conjunctivae and EOM are normal. Pupils are equal, round, and reactive to light. No scleral icterus.  Neck: Normal range of motion. Neck supple. No tracheal deviation present.  Cardiovascular: Normal rate, regular rhythm and normal heart sounds.   Pulmonary/Chest: Effort normal. No respiratory distress. She has no wheezes. She has no rales.  Abdominal: She exhibits no mass. There is no tenderness. There is no rebound and no guarding.  Musculoskeletal: She exhibits no edema.  Lymphadenopathy:    She has no cervical adenopathy.  Neurological: She is alert and oriented to person, place, and time. Coordination normal.  Skin: Skin is warm and dry. No rash noted.  Psychiatric: She has a normal mood and affect. Her behavior is normal.      Assessment & Plan:   Dana Duffy was seen today for follow-up.  Diagnoses and all orders for this visit:  Gastritis and gastroduodenitis -     CBC -     Comprehensive metabolic panel -     H. pylori breath test -     Amylase -     Lipase  Other orders -     lansoprazole (PREVACID) 30 MG capsule; Take 1 capsule (30 mg total) by mouth daily at 12 noon. -     sucralfate (CARAFATE) 1 g tablet; 1 tablet 1 hr ac and hs  I am having Dana Duffy start on lansoprazole and sucralfate. I am also having her maintain her acidophilus, Calcium Carbonate (CALCIUM 600 PO), Vitamin D, DiphenhydrAMINE HCl (BENADRYL ALLERGY PO), Psyllium (METAMUCIL PO), B-D ASSURE BPM/AUTO ARM CUFF, simvastatin, and levothyroxine.  Meds ordered this encounter  Medications  . lansoprazole (PREVACID) 30 MG capsule    Sig: Take 1 capsule (30 mg total) by mouth daily at 12 noon.    Dispense:  30 capsule    Refill:  5  . sucralfate (CARAFATE) 1 g tablet    Sig: 1 tablet 1 hr ac and hs    Dispense:  120 tablet    Refill:  0    Appropriate red flag conditions were discussed with the patient as well as actions that should be taken.  Patient expressed his  understanding.  Follow-up: Return if symptoms worsen or fail to improve.  Roselee Culver, MD

## 2015-10-19 NOTE — Telephone Encounter (Signed)
Pt advised to RTC. 

## 2015-10-19 NOTE — Telephone Encounter (Signed)
Pt states she was to call Dr Synetta Shadow back if no better and she still have the heartburn along with back pain Please call [phone]

## 2015-10-20 LAB — COMPREHENSIVE METABOLIC PANEL
ALT: 13 U/L (ref 6–29)
AST: 21 U/L (ref 10–35)
Albumin: 4.1 g/dL (ref 3.6–5.1)
Alkaline Phosphatase: 72 U/L (ref 33–130)
BUN: 13 mg/dL (ref 7–25)
CHLORIDE: 104 mmol/L (ref 98–110)
CO2: 26 mmol/L (ref 20–31)
Calcium: 9.4 mg/dL (ref 8.6–10.4)
Creat: 0.68 mg/dL (ref 0.60–0.93)
Glucose, Bld: 83 mg/dL (ref 65–99)
POTASSIUM: 4.9 mmol/L (ref 3.5–5.3)
Sodium: 141 mmol/L (ref 135–146)
TOTAL PROTEIN: 6.9 g/dL (ref 6.1–8.1)
Total Bilirubin: 1.1 mg/dL (ref 0.2–1.2)

## 2015-10-20 LAB — CBC
HCT: 40.9 % (ref 36.0–46.0)
Hemoglobin: 13.8 g/dL (ref 12.0–15.0)
MCH: 31.2 pg (ref 26.0–34.0)
MCHC: 33.7 g/dL (ref 30.0–36.0)
MCV: 92.3 fL (ref 78.0–100.0)
MPV: 10.7 fL (ref 8.6–12.4)
PLATELETS: 265 10*3/uL (ref 150–400)
RBC: 4.43 MIL/uL (ref 3.87–5.11)
RDW: 13.8 % (ref 11.5–15.5)
WBC: 5.9 10*3/uL (ref 4.0–10.5)

## 2015-10-20 LAB — LIPASE: Lipase: 53 U/L (ref 7–60)

## 2015-10-20 LAB — AMYLASE: AMYLASE: 44 U/L (ref 0–105)

## 2015-10-21 LAB — H. PYLORI BREATH TEST: H. pylori Breath Test: NOT DETECTED

## 2015-10-21 NOTE — Addendum Note (Signed)
Addended by: Roselee Culver on: 10/21/2015 05:54 PM   Modules accepted: Orders

## 2015-10-22 ENCOUNTER — Telehealth: Payer: Self-pay

## 2015-10-22 NOTE — Telephone Encounter (Signed)
Pt is not sure if an ultrasound of her intestines is the best course of action. She would like a CB to discuss if this is truly necessary. Please advise

## 2015-10-22 NOTE — Telephone Encounter (Signed)
Pt wanted me to let referrals know that Monday's and Thursdays are best for her [place]

## 2015-10-22 NOTE — Telephone Encounter (Signed)
Please advise 

## 2015-10-24 NOTE — Telephone Encounter (Signed)
Not of her intestines we are looking at her gallbladder and liver

## 2015-10-25 ENCOUNTER — Encounter: Payer: Self-pay | Admitting: Internal Medicine

## 2015-10-25 ENCOUNTER — Emergency Department (HOSPITAL_COMMUNITY): Payer: Medicare Other

## 2015-10-25 ENCOUNTER — Emergency Department (HOSPITAL_COMMUNITY)
Admission: EM | Admit: 2015-10-25 | Discharge: 2015-10-25 | Disposition: A | Discharge status: Home / Self Care | Payer: Medicare Other | Attending: Emergency Medicine | Admitting: Emergency Medicine

## 2015-10-25 ENCOUNTER — Encounter (HOSPITAL_COMMUNITY): Payer: Self-pay

## 2015-10-25 DIAGNOSIS — K8020 Calculus of gallbladder without cholecystitis without obstruction: Secondary | ICD-10-CM

## 2015-10-25 DIAGNOSIS — M25512 Pain in left shoulder: Secondary | ICD-10-CM | POA: Insufficient documentation

## 2015-10-25 DIAGNOSIS — M25511 Pain in right shoulder: Secondary | ICD-10-CM | POA: Insufficient documentation

## 2015-10-25 DIAGNOSIS — R1010 Upper abdominal pain, unspecified: Secondary | ICD-10-CM | POA: Diagnosis present

## 2015-10-25 DIAGNOSIS — Z880 Allergy status to penicillin: Secondary | ICD-10-CM | POA: Diagnosis not present

## 2015-10-25 DIAGNOSIS — Z862 Personal history of diseases of the blood and blood-forming organs and certain disorders involving the immune mechanism: Secondary | ICD-10-CM | POA: Diagnosis not present

## 2015-10-25 DIAGNOSIS — Z79899 Other long term (current) drug therapy: Secondary | ICD-10-CM | POA: Diagnosis not present

## 2015-10-25 DIAGNOSIS — M549 Dorsalgia, unspecified: Secondary | ICD-10-CM | POA: Diagnosis not present

## 2015-10-25 DIAGNOSIS — E079 Disorder of thyroid, unspecified: Secondary | ICD-10-CM | POA: Diagnosis not present

## 2015-10-25 DIAGNOSIS — Z8739 Personal history of other diseases of the musculoskeletal system and connective tissue: Secondary | ICD-10-CM | POA: Insufficient documentation

## 2015-10-25 DIAGNOSIS — R1011 Right upper quadrant pain: Secondary | ICD-10-CM | POA: Diagnosis not present

## 2015-10-25 LAB — CBC
HEMATOCRIT: 39.1 % (ref 36.0–46.0)
HEMOGLOBIN: 12.8 g/dL (ref 12.0–15.0)
MCH: 31 pg (ref 26.0–34.0)
MCHC: 32.7 g/dL (ref 30.0–36.0)
MCV: 94.7 fL (ref 78.0–100.0)
Platelets: 186 10*3/uL (ref 150–400)
RBC: 4.13 MIL/uL (ref 3.87–5.11)
RDW: 13.2 % (ref 11.5–15.5)
WBC: 5 10*3/uL (ref 4.0–10.5)

## 2015-10-25 LAB — BASIC METABOLIC PANEL
ANION GAP: 11 (ref 5–15)
BUN: 14 mg/dL (ref 6–20)
CALCIUM: 9.8 mg/dL (ref 8.9–10.3)
CHLORIDE: 104 mmol/L (ref 101–111)
CO2: 26 mmol/L (ref 22–32)
Creatinine, Ser: 0.75 mg/dL (ref 0.44–1.00)
GFR calc non Af Amer: >60 mL/min (ref >60)
GLUCOSE: 98 mg/dL (ref 65–99)
POTASSIUM: 4 mmol/L (ref 3.5–5.1)
Sodium: 141 mmol/L (ref 135–145)

## 2015-10-25 LAB — HEPATIC FUNCTION PANEL
ALBUMIN: 3.6 g/dL (ref 3.5–5.0)
ALK PHOS: 72 U/L (ref 38–126)
ALT: 13 U/L — AB (ref 14–54)
AST: 23 U/L (ref 15–41)
BILIRUBIN DIRECT: 0.1 mg/dL (ref 0.1–0.5)
BILIRUBIN TOTAL: 1 mg/dL (ref 0.3–1.2)
Indirect Bilirubin: 0.9 mg/dL (ref 0.3–0.9)
Total Protein: 6.5 g/dL (ref 6.5–8.1)

## 2015-10-25 LAB — I-STAT TROPONIN, ED: TROPONIN I, POC: 0.01 ng/mL (ref 0.00–0.08)

## 2015-10-25 MED ORDER — OMEPRAZOLE 20 MG PO CPDR: 20.0000 mg | DELAYED_RELEASE_CAPSULE | Freq: Every day | ORAL | Status: DC

## 2015-10-25 MED ORDER — FAMOTIDINE 20 MG PO TABS: 20.0000 mg | ORAL_TABLET | Freq: Two times a day (BID) | ORAL | Status: DC

## 2015-10-25 MED ORDER — ACETAMINOPHEN 500 MG PO TABS
1000.0000 mg | ORAL_TABLET | Freq: Once | ORAL | Status: AC
  Administered 2015-10-25: 1000 mg via ORAL
  Filled 2015-10-25: qty 2

## 2015-10-25 NOTE — Telephone Encounter (Signed)
Advised pt message from Dr. Ouida Sills. She states she is not improving at  All and would like to proceed with the [place] but states it is not until next week. Can we see if we can move this up. She is available tomorrow and Thursday.

## 2015-10-25 NOTE — ED Notes (Signed)
Patient here with left scapula pain that is radiating around to epigastric pain/ chest pain. Has ben seen for same but has been treated for muscular pain

## 2015-10-25 NOTE — ED Notes (Signed)
Patient verbalized understanding of discharge instructions and denies any further needs or questions at this time. VS stable. Patient declined wheelchair. Patient ambulatory with steady gait.

## 2015-10-25 NOTE — ED Notes (Signed)
MD at bedside. 

## 2015-10-25 NOTE — ED Notes (Signed)
MD to see and assess patient before RN assessment. See MD assessment. 

## 2015-10-25 NOTE — ED Provider Notes (Signed)
CSN: [id number]     Arrival date & time 10/25/15  1643 History   First MD Initiated Contact with Patient 10/25/15 1936     Chief Complaint  Patient presents with  . Back Pain  . epigastric pain      (Consider location/radiation/quality/duration/timing/severity/associated sxs/prior Treatment) HPI  The patient is a 70 year old female, she presents after having approximately 10 days' worth of symptoms including vague upper abdominal discomfort but more prominently back pain and pain to her bilateral shoulder blades, this is worse with position, somewhat worse with eating, somewhat worse with deep breathing, she has been able to eat and drink and has no significant pain after eating. She has been to the urgent care a couple of times, initially she was tested for H. pylori which was negative, she then returned to the urgent care several days later and was given Carafate and Pepcid which she has been taking since. She continues to have symptoms, of note the patient does use aspirin fairly frequently, she avoids other NSAIDs, occasional wine   Past Medical History  Diagnosis Date  . Allergy   . Arthritis   . Anemia   . Thyroid disease    Past Surgical History  Procedure Laterality Date  . Bunionectomy     Family History  Problem Relation Age of Onset  . Heart disease Mother   . Hyperlipidemia Mother   . Hypertension Mother   . Asthma Son   . Depression Maternal Grandmother   . Hyperlipidemia Maternal Grandmother   . Diabetes Father   . Cancer Sister    Social History  Substance Use Topics  . Smoking status: Never Smoker   . Smokeless tobacco: None  . Alcohol Use: 1.2 oz/week    2 Glasses of wine per week     Comment: occas   OB History    No data available     Review of Systems  All other systems reviewed and are negative.     Allergies  Gluten meal; Sulfa antibiotics; and Penicillins  Home Medications   Prior to Admission medications   Medication Sig Start Date  End Date Taking? Authorizing Provider  acidophilus (RISAQUAD) CAPS Take by mouth daily.   Yes Historical Provider, MD  [place] 2 each by mouth daily.   Yes Historical Provider, MD  lansoprazole (PREVACID) 30 MG capsule Take 1 capsule (30 mg total) by mouth daily at 12 noon. 10/19/15  Yes Roselee Culver, MD  levothyroxine (SYNTHROID) 100 MCG tablet Take 1 tablet (100 mcg total) by mouth daily. 03/04/15  Yes Leandrew Koyanagi, MD  simvastatin (ZOCOR) 20 MG tablet Take 1 tablet (20 mg total) by mouth at bedtime. 03/04/15  Yes Leandrew Koyanagi, MD  sucralfate (CARAFATE) 1 g tablet 1 tablet 1 hr ac and hs 10/19/15  Yes Roselee Culver, MD  Blood Pressure Monitoring (B-D ASSURE BPM/AUTO ARM CUFF) MISC Dx elevated BP--unsure if HTN 03/03/15   Leandrew Koyanagi, MD  famotidine (PEPCID) 20 MG tablet Take 1 tablet (20 mg total) by mouth 2 (two) times daily. 10/25/15   Noemi Chapel, MD  omeprazole (PRILOSEC) 20 MG capsule Take 1 capsule (20 mg total) by mouth daily. 10/25/15   Noemi Chapel, MD   BP 158/84 mmHg  Pulse 66  Temp(Src) 98.1 F (36.7 C) (Oral)  Resp 16  SpO2 98% Physical Exam  Constitutional: She appears well-developed and well-nourished. No distress.  HENT:  Head: Normocephalic and atraumatic.  Mouth/Throat: Oropharynx is  clear and moist. No oropharyngeal exudate.  Eyes: Conjunctivae and EOM are normal. Pupils are equal, round, and reactive to light. Right eye exhibits no discharge. Left eye exhibits no discharge. No scleral icterus.  Neck: Normal range of motion. Neck supple. No JVD present. No thyromegaly present.  Cardiovascular: Normal rate, regular rhythm, normal heart sounds and intact distal pulses.  Exam reveals no gallop and no friction rub.   No murmur heard. Pulmonary/Chest: Effort normal and breath sounds normal. No respiratory distress. She has no wheezes. She has no rales.  Abdominal: Soft. Bowel sounds are normal. She exhibits no distension and no  mass. There is tenderness ( mild RUQ ttp).  Musculoskeletal: Normal range of motion. She exhibits no edema or tenderness.  Lymphadenopathy:    She has no cervical adenopathy.  Neurological: She is alert. Coordination normal.  Skin: Skin is warm and dry. No rash noted. No erythema.  Psychiatric: She has a normal mood and affect. Her behavior is normal.  Nursing note and vitals reviewed.   ED Course  Procedures (including critical care time) Labs Review Labs Reviewed  HEPATIC FUNCTION PANEL - Abnormal; Notable for the following:    ALT 13 (*)    All other components within normal limits  BASIC METABOLIC PANEL  CBC  I-STAT TROPOININ, ED    Imaging Review Dg Chest 2 View  10/25/2015  CLINICAL DATA:  Acute chest pain. EXAM: CHEST  2 VIEW COMPARISON:  October 15, 2015. FINDINGS: The heart size and mediastinal contours are within normal limits. Both lungs are clear. No pneumothorax or pleural effusion is noted. The visualized skeletal structures are unremarkable. IMPRESSION: No active cardiopulmonary disease. Electronically Signed   By: Marijo Conception, M.D.   On: 10/25/2015 18:46   US Abdomen Complete  10/25/2015  CLINICAL DATA:  Abdominal pain.  Upper back pain. EXAM: ABDOMEN ULTRASOUND COMPLETE COMPARISON:  None. FINDINGS: Gallbladder: Multiple gallstones observed. Sonographic Murphy's sign absent. No pericholecystic fluid or gallbladder wall thickening. Common bile duct: Diameter: 6 mm Liver: Several small hepatic cysts in the left hepatic lobe, subcentimeter size. Otherwise negative. Within normal limits in parenchymal echogenicity. IVC: No abnormality visualized. Pancreas: Not seen due to overlying bowel gas. Spleen: Size and appearance within normal limits. Right Kidney: Length: 10.6 cm. Echogenicity within normal limits. No mass or hydronephrosis visualized. The lower pole of the kidney is obscured Left Kidney: Length: 10.5 cm. Echogenicity within normal limits. No mass or hydronephrosis  visualized. Abdominal aorta: No aneurysm visualized. Other findings: None. IMPRESSION: 1. Cholelithiasis. Common bile duct within normal limits. No gallbladder wall thickening or pericholecystic fluid. Sonographic Murphy's sign absent. 2. Several small hepatic cysts are noted. 3. Nonvisualization of the pancreas due to overlying bowel gas. Electronically Signed   By: Van Clines M.D.   On: 10/25/2015 21:44   I have personally reviewed and evaluated these images and lab results as part of my medical decision-making.   EKG Interpretation   Date/Time:  Monday October 25 2015 17:06:27 EST Ventricular Rate:  66 PR Interval:  176 QRS Duration: 108 QT Interval:  428 QTC Calculation: 448 R Axis:   44 Text Interpretation:  Normal sinus rhythm Possible Left atrial enlargement  Incomplete right bundle branch block Borderline ECG No old tracing to  compare Confirmed by Nerea Bordenave  MD, [place] ([id number]) on 10/25/2015 7:36:40 PM      MDM   Final diagnoses:  Right upper quadrant pain    Possible chole / biliary colic - add LFT's to nursing  orders salin [place]ock [place] Pt appaers stable, nonsurgical abdomen.  Labs unremarkable Has Gall Stones - no signs of acute chole, Pt referred to outpt gen surg Start antacids and counselled re: GI protective food and Behaviors / lifestyle modification Pt expresses understanding.  Meds given in ED:  Medications  acetaminophen (TYLENOL) tablet 1,000 mg (1,000 mg Oral Given 10/25/15 2205)    Discharge Medication List as of 10/25/2015 10:58 PM    START taking these medications   Details  famotidine (PEPCID) 20 MG tablet Take 1 tablet (20 mg total) by mouth 2 (two) times daily., Starting 10/25/2015, Until Discontinued, Print    omeprazole (PRILOSEC) 20 MG capsule Take 1 capsule (20 mg total) by mouth daily., Starting 10/25/2015, Until Discontinued, Print          Noemi Chapel, MD 10/26/15 1459

## 2015-10-25 NOTE — Discharge Instructions (Signed)

## 2015-10-29 ENCOUNTER — Ambulatory Visit (INDEPENDENT_AMBULATORY_CARE_PROVIDER_SITE_OTHER): Payer: Medicare Other

## 2015-10-29 ENCOUNTER — Ambulatory Visit (INDEPENDENT_AMBULATORY_CARE_PROVIDER_SITE_OTHER): Payer: Medicare Other | Admitting: Internal Medicine

## 2015-10-29 VITALS — BP 130/82 | HR 83 | Temp 97.6°F | Resp 20 | Ht 63.0 in | Wt 160.8 lb

## 2015-10-29 DIAGNOSIS — R0789 Other chest pain: Secondary | ICD-10-CM

## 2015-10-29 MED ORDER — PROMETHAZINE-CODEINE 6.25-10 MG/5ML PO SYRP: 5.0000 mL | ORAL_SOLUTION | Freq: Four times a day (QID) | ORAL | Status: DC | PRN

## 2015-10-29 NOTE — Progress Notes (Signed)
Subjective:  By signing my name below, I, Dana Duffy, attest that this documentation has been prepared under the direction and in the presence of Ellamae Sia, MD.  Watt Climes Duffy, Medical Scribe. 10/29/2015.  11:19 AM.   Patient ID: Dana Duffy, female    DOB: April 28, 1946, 70 y.o.   MRN: [id number]  Chief Complaint  Patient presents with  . Follow-up    back pain, upper side   . Shortness of Breath  . GI Problem    yellow stool    HPI HPI Comments: Dana Duffy is a 70 y.o. female who presents to [facility] for a follow up regarding upper back pain. Pt was recently seen in the ED on 01/02 for back and epigastric pain that worsens with position, eating, and deep breathing. US showed gall stones, but no signs of acute chole. She was referred to outpatient general surgery, and was started on antacids. Pt was also seen previously here at the office by Dr. Milus Glazier initially, and followed up with Dr. Dareen Piano. See visit notes.  Today, pt notes that her symptoms have been worsening. Pt states that the pain started initially in the left side of her upper back when she was first seen by Dr. Milus Glazier, however it has now radiated to the right side. She describes the pain as sharp, and is exacerbated with ROM, taking deep breaths, and occassionally walking. She indicates that the pain has recently been alternating from the different sides of her back. She notes that she did have severe coughing episodes with secondary chest wall pain previous to the onset of the symptoms. She states that oral intake does not alter her symptoms. She also reports that her stool has been increasingly getting more yellow in color. Pt is compliant with taking Omeprazole and famotidine. She indicates that she followed up with surgery and was advised that there was abnormalities in her gallbladder. She denies current symptoms of nausea, rash, or traumatic injuries to the area.      Patient  Active Problem List   Diagnosis Date Noted  . Hammer toe of left foot 06/18/2014  . BMI 27.0-27.9,adult 06/12/2012  . Hypothyroidism 06/12/2012  . Hyperlipidemia 06/12/2012   Past Medical History  Diagnosis Date  . Allergy   . Arthritis   . Anemia   . Thyroid disease    Past Surgical History  Procedure Laterality Date  . Bunionectomy     Allergies  Allergen Reactions  . Gluten Meal Other (See Comments)    Avoids gluten   . Sulfa Antibiotics Other (See Comments)    SKIN REDNESS  . Penicillins Other (See Comments)   Prior to Admission medications   Medication Sig Start Date End Date Taking? Authorizing Provider  acidophilus (RISAQUAD) CAPS Take by mouth daily.   Yes Historical Provider, MD  famotidine (PEPCID) 20 MG tablet Take 1 tablet (20 mg total) by mouth 2 (two) times daily. 10/25/15  Yes Eber Hong, MD  FIBER SELECT GUMMIES CHEW Chew 2 each by mouth daily.   Yes Historical Provider, MD  lansoprazole (PREVACID) 30 MG capsule Take 1 capsule (30 mg total) by mouth daily at 12 noon. 10/19/15  Yes Carmelina Dane, MD  levothyroxine (SYNTHROID) 100 MCG tablet Take 1 tablet (100 mcg total) by mouth daily. 03/04/15  Yes Tonye Pearson, MD  omeprazole (PRILOSEC) 20 MG capsule Take 1 capsule (20 mg total) by mouth daily. 10/25/15  Yes Eber Hong, MD  simvastatin (ZOCOR) 20  MG tablet Take 1 tablet (20 mg total) by mouth at bedtime. 03/04/15  Yes Tonye Pearson, MD  Blood Pressure Monitoring (B-D ASSURE BPM/AUTO ARM CUFF) MISC Dx elevated BP--unsure if HTN Patient not taking: Reported on 10/29/2015 03/03/15   Tonye Pearson, MD  sucralfate (CARAFATE) 1 g tablet 1 tablet 1 hr ac and hs Patient not taking: Reported on 10/29/2015 10/19/15   Carmelina Dane, MD   Social History   Social History  . Marital Status: Divorced    Spouse Name: N/A  . Number of Children: N/A  . Years of Education: N/A   Occupational History  . Artist    Social History Main Topics  .  Smoking status: Never Smoker   . Smokeless tobacco: Not on file  . Alcohol Use: 1.2 oz/week    2 Glasses of wine per week     Comment: occas  . Drug Use: No  . Sexual Activity: Not on file   Other Topics Concern  . Not on file   Social History Narrative   5 pregnancies   2 living children   2 miscarriages   Divorced.          Review of Systems  Gastrointestinal: Negative for nausea.  Musculoskeletal: Positive for myalgias and back pain.  Skin: Negative for rash.      Objective:   Physical Exam  Constitutional: She is oriented to person, place, and time. She appears well-developed and well-nourished. No distress.  HENT:  Head: Normocephalic and atraumatic.  Eyes: EOM are normal. Pupils are equal, round, and reactive to light.  Neck: Neck supple.  Cardiovascular: Normal rate.   Pulmonary/Chest: Effort normal.  Musculoskeletal:  Tender to palp and rom over R posterior ribs around T8 without rash in dermatome or bony defect/redness  Neurological: She is alert and oriented to person, place, and time. No cranial nerve deficit.  Skin: Skin is warm and dry.  Psychiatric: She has a normal mood and affect. Her behavior is normal.  Nursing note and vitals reviewed.   [facility] (PRIMARY) x-ray report read by Dr. Ellamae Sia, MD: No compression fracture. No rib fracture. Lungs are clear.  BP 130/82 mmHg  Pulse 83  Temp(Src) 97.6 F (36.4 C) (Oral)  Resp 20  Ht 5\' 3"  (1.6 m)  Wt 160 lb 12.8 oz (72.938 kg)  BMI 28.49 kg/m2  SpO2 98%     Assessment & Plan:  Chest wall pain - Plan: DG Ribs Unilateral W/Chest Right musculoskel in nature and not assoc with gallstones/abd pain  Meds ordered this encounter  Medications  . promethazine-codeine (PHENERGAN WITH CODEINE) 6.25-10 MG/5ML syrup    Sig: Take 5 mLs by mouth every 6 (six) hours as needed for cough.    Dispense:  120 mL    Refill:  0   Ok for ibuprof as well\ Heat and stretch FU Dr Dwain Sarna    I have  completed the patient encounter in its entirety as documented by the scribe, with editing by me where necessary. Leonard Feigel P. Merla Riches, M.D.

## 2015-11-03 ENCOUNTER — Other Ambulatory Visit: Payer: Medicare Other

## 2015-11-04 ENCOUNTER — Encounter: Payer: Self-pay | Admitting: Internal Medicine

## 2015-11-30 ENCOUNTER — Encounter (HOSPITAL_BASED_OUTPATIENT_CLINIC_OR_DEPARTMENT_OTHER): Payer: Self-pay | Admitting: *Deleted

## 2015-12-02 ENCOUNTER — Other Ambulatory Visit: Payer: Self-pay | Admitting: General Surgery

## 2015-12-06 ENCOUNTER — Ambulatory Visit (HOSPITAL_BASED_OUTPATIENT_CLINIC_OR_DEPARTMENT_OTHER): Payer: Medicare Other | Admitting: Anesthesiology

## 2015-12-06 ENCOUNTER — Encounter (HOSPITAL_BASED_OUTPATIENT_CLINIC_OR_DEPARTMENT_OTHER)
Admission: RE | Disposition: A | Discharge status: Home / Self Care | Payer: Self-pay | Source: Ambulatory Visit | Attending: General Surgery

## 2015-12-06 ENCOUNTER — Ambulatory Visit (HOSPITAL_BASED_OUTPATIENT_CLINIC_OR_DEPARTMENT_OTHER)
Admission: RE | Admit: 2015-12-06 | Discharge: 2015-12-06 | Disposition: A | Discharge status: Home / Self Care | Payer: Medicare Other | Source: Ambulatory Visit | Attending: General Surgery | Admitting: General Surgery

## 2015-12-06 ENCOUNTER — Encounter (HOSPITAL_BASED_OUTPATIENT_CLINIC_OR_DEPARTMENT_OTHER): Payer: Self-pay | Admitting: *Deleted

## 2015-12-06 DIAGNOSIS — K8010 Calculus of gallbladder with chronic cholecystitis without obstruction: Secondary | ICD-10-CM | POA: Diagnosis present

## 2015-12-06 DIAGNOSIS — E039 Hypothyroidism, unspecified: Secondary | ICD-10-CM | POA: Diagnosis not present

## 2015-12-06 HISTORY — DX: Calculus of gallbladder without cholecystitis without obstruction: K80.20

## 2015-12-06 HISTORY — PX: CHOLECYSTECTOMY: SHX55

## 2015-12-06 HISTORY — DX: Hypothyroidism, unspecified: E03.9

## 2015-12-06 SURGERY — LAPAROSCOPIC CHOLECYSTECTOMY
Anesthesia: General | Site: Abdomen

## 2015-12-06 MED ORDER — LACTATED RINGERS IV SOLN
INTRAVENOUS | Status: DC
  Administered 2015-12-06: 08:00:00 via INTRAVENOUS

## 2015-12-06 MED ORDER — LIDOCAINE HCL (CARDIAC) 20 MG/ML IV SOLN
INTRAVENOUS | Status: AC
  Filled 2015-12-06: qty 5

## 2015-12-06 MED ORDER — SUGAMMADEX SODIUM 200 MG/2ML IV SOLN
INTRAVENOUS | Status: DC | PRN
  Administered 2015-12-06: 200 mg via INTRAVENOUS

## 2015-12-06 MED ORDER — BUPIVACAINE HCL (PF) 0.25 % IJ SOLN
INTRAMUSCULAR | Status: DC | PRN
  Administered 2015-12-06: 8 mL

## 2015-12-06 MED ORDER — LIDOCAINE HCL (CARDIAC) 20 MG/ML IV SOLN
INTRAVENOUS | Status: DC | PRN
  Administered 2015-12-06: 60 mg via INTRAVENOUS

## 2015-12-06 MED ORDER — ONDANSETRON HCL 4 MG/2ML IJ SOLN
INTRAMUSCULAR | Status: DC | PRN
  Administered 2015-12-06: 4 mg via INTRAVENOUS

## 2015-12-06 MED ORDER — CEFAZOLIN SODIUM-DEXTROSE 2-3 GM-% IV SOLR
2.0000 g | INTRAVENOUS | Status: AC
Start: 2015-12-06 — End: 2015-12-06
  Administered 2015-12-06: 2 g via INTRAVENOUS

## 2015-12-06 MED ORDER — CEFAZOLIN SODIUM-DEXTROSE 2-3 GM-% IV SOLR
INTRAVENOUS | Status: AC
  Filled 2015-12-06: qty 50

## 2015-12-06 MED ORDER — HYDROMORPHONE HCL 1 MG/ML IJ SOLN
INTRAMUSCULAR | Status: AC
  Filled 2015-12-06: qty 1

## 2015-12-06 MED ORDER — KETOROLAC TROMETHAMINE 15 MG/ML IJ SOLN: INTRAMUSCULAR | Status: DC | PRN

## 2015-12-06 MED ORDER — KETOROLAC TROMETHAMINE 30 MG/ML IJ SOLN
INTRAMUSCULAR | Status: DC | PRN
  Administered 2015-12-06: 15 mg via INTRAVENOUS

## 2015-12-06 MED ORDER — FENTANYL CITRATE (PF) 100 MCG/2ML IJ SOLN
INTRAMUSCULAR | Status: AC
Start: 2015-12-06 — End: 2015-12-06
  Filled 2015-12-06: qty 2

## 2015-12-06 MED ORDER — FENTANYL CITRATE (PF) 100 MCG/2ML IJ SOLN
50.0000 ug | INTRAMUSCULAR | Status: DC | PRN
  Administered 2015-12-06: 100 ug via INTRAVENOUS

## 2015-12-06 MED ORDER — ROCURONIUM BROMIDE 50 MG/5ML IV SOLN
INTRAVENOUS | Status: AC
  Filled 2015-12-06: qty 1

## 2015-12-06 MED ORDER — DEXAMETHASONE SODIUM PHOSPHATE 10 MG/ML IJ SOLN
INTRAMUSCULAR | Status: AC
  Filled 2015-12-06: qty 1

## 2015-12-06 MED ORDER — GLYCOPYRROLATE 0.2 MG/ML IJ SOLN: 0.2000 mg | Freq: Once | INTRAMUSCULAR | Status: DC | PRN

## 2015-12-06 MED ORDER — SODIUM CHLORIDE 0.9 % IR SOLN
Status: DC | PRN
  Administered 2015-12-06: 1

## 2015-12-06 MED ORDER — PROPOFOL 10 MG/ML IV BOLUS
INTRAVENOUS | Status: DC | PRN
  Administered 2015-12-06: 175 mg via INTRAVENOUS

## 2015-12-06 MED ORDER — HYDROMORPHONE HCL 1 MG/ML IJ SOLN
0.2500 mg | INTRAMUSCULAR | Status: DC | PRN
  Administered 2015-12-06: 0.5 mg via INTRAVENOUS
  Administered 2015-12-06: 0.25 mg via INTRAVENOUS
  Administered 2015-12-06 (×2): 0.5 mg via INTRAVENOUS

## 2015-12-06 MED ORDER — ONDANSETRON HCL 4 MG/2ML IJ SOLN
INTRAMUSCULAR | Status: AC
  Filled 2015-12-06: qty 2

## 2015-12-06 MED ORDER — MIDAZOLAM HCL 2 MG/2ML IJ SOLN
1.0000 mg | INTRAMUSCULAR | Status: DC | PRN
  Administered 2015-12-06: 1 mg via INTRAVENOUS

## 2015-12-06 MED ORDER — DEXAMETHASONE SODIUM PHOSPHATE 4 MG/ML IJ SOLN
INTRAMUSCULAR | Status: DC | PRN
  Administered 2015-12-06: 10 mg via INTRAVENOUS

## 2015-12-06 MED ORDER — HYDROCODONE-ACETAMINOPHEN 10-325 MG PO TABS: 1.0000 | ORAL_TABLET | Freq: Four times a day (QID) | ORAL | Status: AC | PRN

## 2015-12-06 MED ORDER — KETOROLAC TROMETHAMINE 30 MG/ML IJ SOLN
INTRAMUSCULAR | Status: AC
  Filled 2015-12-06: qty 1

## 2015-12-06 MED ORDER — MIDAZOLAM HCL 2 MG/2ML IJ SOLN
INTRAMUSCULAR | Status: AC
  Filled 2015-12-06: qty 2

## 2015-12-06 MED ORDER — ROCURONIUM BROMIDE 100 MG/10ML IV SOLN
INTRAVENOUS | Status: DC | PRN
  Administered 2015-12-06: 30 mg via INTRAVENOUS

## 2015-12-06 MED ORDER — HYDROMORPHONE HCL 1 MG/ML IJ SOLN
INTRAMUSCULAR | Status: AC
Start: 2015-12-06 — End: 2015-12-06
  Filled 2015-12-06: qty 1

## 2015-12-06 MED ORDER — SCOPOLAMINE 1 MG/3DAYS TD PT72: 1.0000 | MEDICATED_PATCH | Freq: Once | TRANSDERMAL | Status: DC | PRN

## 2015-12-06 SURGICAL SUPPLY — 44 items
APPLIER CLIP 5 13 M/L LIGAMAX5 (MISCELLANEOUS) ×2
BLADE CLIPPER SURG (BLADE) IMPLANT
CANISTER SUCT 1200ML W/VALVE (MISCELLANEOUS) ×2 IMPLANT
CHLORAPREP W/TINT 26ML (MISCELLANEOUS) ×2 IMPLANT
CLIP APPLIE 5 13 M/L LIGAMAX5 (MISCELLANEOUS) ×1 IMPLANT
COVER MAYO STAND STRL (DRAPES) IMPLANT
DECANTER SPIKE VIAL GLASS SM (MISCELLANEOUS) IMPLANT
DEVICE TROCAR PUNCTURE CLOSURE (ENDOMECHANICALS) ×2 IMPLANT
DRAPE C-ARM 42X72 X-RAY (DRAPES) IMPLANT
DRAPE LAPAROSCOPIC ABDOMINAL (DRAPES) ×2 IMPLANT
ELECT REM PT RETURN 9FT ADLT (ELECTROSURGICAL) ×2
ELECTRODE REM PT RTRN 9FT ADLT (ELECTROSURGICAL) ×1 IMPLANT
FILTER SMOKE EVAC LAPAROSHD (FILTER) IMPLANT
GLOVE BIO SURGEON STRL SZ7 (GLOVE) ×2 IMPLANT
GLOVE BIOGEL PI IND STRL 6.5 (GLOVE) ×2 IMPLANT
GLOVE BIOGEL PI IND STRL 7.0 (GLOVE) ×1 IMPLANT
GLOVE BIOGEL PI IND STRL 7.5 (GLOVE) ×1 IMPLANT
GLOVE BIOGEL PI INDICATOR 6.5 (GLOVE) ×2
GLOVE BIOGEL PI INDICATOR 7.0 (GLOVE) ×1
GLOVE BIOGEL PI INDICATOR 7.5 (GLOVE) ×1
GLOVE ECLIPSE 6.5 STRL STRAW (GLOVE) ×4 IMPLANT
GOWN STRL REUS W/ TWL LRG LVL3 (GOWN DISPOSABLE) ×3 IMPLANT
GOWN STRL REUS W/TWL LRG LVL3 (GOWN DISPOSABLE) ×3
HEMOSTAT SNOW SURGICEL 2X4 (HEMOSTASIS) IMPLANT
LIQUID BAND (GAUZE/BANDAGES/DRESSINGS) ×2 IMPLANT
NS IRRIG 1000ML POUR BTL (IV SOLUTION) ×2 IMPLANT
PACK BASIN DAY SURGERY FS (CUSTOM PROCEDURE TRAY) ×2 IMPLANT
POUCH RETRIEVAL ECOSAC 10 (ENDOMECHANICALS) ×1 IMPLANT
POUCH RETRIEVAL ECOSAC 10MM (ENDOMECHANICALS) ×1
SCISSORS LAP 5X35 DISP (ENDOMECHANICALS) ×2 IMPLANT
SET CHOLANGIOGRAPH 5 50 .035 (SET/KITS/TRAYS/PACK) IMPLANT
SET IRRIG TUBING LAPAROSCOPIC (IRRIGATION / IRRIGATOR) ×2 IMPLANT
SLEEVE ENDOPATH XCEL 5M (ENDOMECHANICALS) ×4 IMPLANT
SLEEVE SCD COMPRESS KNEE MED (MISCELLANEOUS) ×2 IMPLANT
SPECIMEN JAR SMALL (MISCELLANEOUS) IMPLANT
STRIP CLOSURE SKIN 1/2X4 (GAUZE/BANDAGES/DRESSINGS) ×2 IMPLANT
SUT MNCRL AB 4-0 PS2 18 (SUTURE) ×2 IMPLANT
SUT VICRYL 0 UR6 27IN ABS (SUTURE) ×2 IMPLANT
TOWEL OR 17X24 6PK STRL BLUE (TOWEL DISPOSABLE) ×4 IMPLANT
TRAY LAPAROSCOPIC (CUSTOM PROCEDURE TRAY) ×2 IMPLANT
TROCAR XCEL BLUNT TIP 100MML (ENDOMECHANICALS) ×2 IMPLANT
TROCAR XCEL NON-BLD 5MMX100MML (ENDOMECHANICALS) ×2 IMPLANT
TUBE CONNECTING 20X1/4 (TUBING) ×2 IMPLANT
TUBING INSUFFLATION (TUBING) ×2 IMPLANT

## 2015-12-06 NOTE — Anesthesia Preprocedure Evaluation (Signed)
Anesthesia Evaluation  Patient identified by MRN, date of birth, ID band Patient awake    Reviewed: Allergy & Precautions, NPO status , Patient's Chart, lab work & pertinent test results  Airway Mallampati: II  TM Distance: >3 FB Neck ROM: Full    Dental no notable dental hx.    Pulmonary neg pulmonary ROS,    Pulmonary exam normal breath sounds clear to auscultation       Cardiovascular negative cardio ROS Normal cardiovascular exam Rhythm:Regular Rate:Normal     Neuro/Psych negative neurological ROS  negative psych ROS   GI/Hepatic negative GI ROS, Neg liver ROS,   Endo/Other  Hypothyroidism   Renal/GU negative Renal ROS  negative genitourinary   Musculoskeletal  (+) Arthritis ,   Abdominal   Peds negative pediatric ROS (+)  Hematology negative hematology ROS (+)   Anesthesia Other Findings   Reproductive/Obstetrics negative OB ROS                             Anesthesia Physical Anesthesia Plan  ASA: II  Anesthesia Plan: General   Post-op Pain Management:    Induction: Intravenous  Airway Management Planned: Oral ETT  Additional Equipment:   Intra-op Plan:   Post-operative Plan: Extubation in OR  Informed Consent: I have reviewed the patients History and Physical, chart, labs and discussed the procedure including the risks, benefits and alternatives for the proposed anesthesia with the patient or authorized representative who has indicated his/her understanding and acceptance.   Dental advisory given  Plan Discussed with: CRNA  Anesthesia Plan Comments:         Anesthesia Quick Evaluation

## 2015-12-06 NOTE — Transfer of Care (Signed)
Immediate Anesthesia Transfer of Care Note  Patient: Dana Duffy  Procedure(s) Performed: Procedure(s): LAPAROSCOPIC CHOLECYSTECTOMY (N/A)  Patient Location: PACU  Anesthesia Type:General  Level of Consciousness: awake and sedated  Airway & Oxygen Therapy: Patient Spontanous Breathing and Patient connected to face mask oxygen  Post-op Assessment: Report given to RN and Post -op Vital signs reviewed and stable  Post vital signs: Reviewed and stable  Last Vitals:  Filed Vitals:   12/06/15 0738  BP: 181/91  Pulse: 68  Temp: 36.6 C  Resp: 20    Complications: No apparent anesthesia complications

## 2015-12-06 NOTE — Interval H&P Note (Signed)
History and Physical Interval Note:  12/06/2015 8:16 AM  Dana Duffy  has presented today for surgery, with the diagnosis of GALLSTONES  The various methods of treatment have been discussed with the patient and family. After consideration of risks, benefits and other options for treatment, the patient has consented to  Procedure(s): LAPAROSCOPIC CHOLECYSTECTOMY (N/A) as a surgical intervention .  The patient's history has been reviewed, patient examined, no change in status, stable for surgery.  I have reviewed the patient's chart and labs.  Questions were answered to the patient's satisfaction.     Hady Niemczyk

## 2015-12-06 NOTE — Discharge Instructions (Signed)
[facility] LAPAROSCOPIC SURGERY: POST OP INSTRUCTIONS  Always review your discharge instruction sheet given to you by the facility where your surgery was performed. IF YOU HAVE DISABILITY OR FAMILY LEAVE FORMS, YOU MUST BRING THEM TO THE OFFICE FOR PROCESSING.   DO NOT GIVE THEM TO YOUR DOCTOR.  1. A prescription for pain medication may be given to you upon discharge.  Take your pain medication as prescribed, if needed.  If narcotic pain medicine is not needed, then you may take acetaminophen (Tylenol), naprosyn (Alleve), or ibuprofen (Advil) as needed. 2. Take your usually prescribed medications unless otherwise directed. 3. If you need a refill on your pain medication, please contact your pharmacy.  They will contact our office to request authorization. Prescriptions will not be filled after 5pm or on week-ends. 4. You should follow a light diet the first few days after arrival home, such as soup and crackers, etc.  Be sure to include lots of fluids daily. 5. Most patients will experience some swelling and bruising in the area of the incisions.  Ice packs will help.  Swelling and bruising can take several days to resolve.  6. It is common to experience some constipation if taking pain medication after surgery.  Increasing fluid intake and taking a stool softener (such as Colace) will usually help or prevent this problem from occurring.  A mild laxative (Milk of Magnesia or Miralax) should be taken according to package instructions if there are no bowel movements after 48 hours. 7. Unless discharge instructions indicate otherwise, you may remove your bandages 48 hours after surgery, and you may shower at that time.  You may have steri-strips (small skin tapes) in place directly over the incision.  These strips should be left on the skin for 7-10 days.  If your surgeon used skin glue on the incision, you may shower in 24 hours.  The glue will flake  off over the next 2-3 weeks.  Any sutures or staples will be removed at the office during your follow-up visit. 8. ACTIVITIES:  You may resume regular (light) daily activities beginning the next day--such as daily self-care, walking, climbing stairs--gradually increasing activities as tolerated.  You may have sexual intercourse when it is comfortable.  Refrain from any heavy lifting or straining until approved by your doctor. a. You may drive when you are no longer taking prescription pain medication, you can comfortably wear a seatbelt, and you can safely maneuver your car and apply brakes. b. RETURN TO WORK:  __________________________________________________________ 9. You should see your doctor in the office for a follow-up appointment approximately 2-3 weeks after your surgery.  Make sure that you call for this appointment within a day or two after you arrive home to insure a convenient appointment time. 10. OTHER INSTRUCTIONS: __________________________________________________________________________________________________________________________ __________________________________________________________________________________________________________________________ WHEN TO CALL YOUR DOCTOR: 1. Fever over 101.0 2. Inability to urinate 3. Continued bleeding from incision. 4. Increased pain, redness, or drainage from the incision. 5. Increasing abdominal pain  The clinic staff is available to answer your questions during regular business hours.  Please dont hesitate to call and ask to speak to one of the nurses for clinical concerns.  If you have a medical emergency, go to the nearest emergency room or call 911.  A surgeon from [facility] is always on call at the hospital. [address], [place], [place], [place]  [address] ? [address], [place], [place]   [address] [phone] ? [phone] ? FAX [phone]  [phone] Web site: [email or web address]     Post  Anesthesia Home Care Instructions  Activity: Get plenty of rest for the remainder of the day. A responsible adult should stay with you for 24 hours following the procedure.  For the next 24 hours, DO NOT: -Drive a car -Paediatric nurse -Drink alcoholic beverages -Take any medication unless instructed by your physician -Make any legal decisions or sign important papers.  Meals: Start with liquid foods such as gelatin or soup. Progress to regular foods as tolerated. Avoid greasy, spicy, heavy foods. If nausea and/or vomiting occur, drink only clear liquids until the nausea and/or vomiting subsides. Call your physician if vomiting continues.  Special Instructions/Symptoms: Your throat may feel dry or sore from the anesthesia or the breathing tube placed in your throat during surgery. If this causes discomfort, gargle with warm salt water. The discomfort should disappear within 24 hours.  If you had a scopolamine patch placed behind your ear for the management of post- operative nausea and/or vomiting:  1. The medication in the patch is effective for 72 hours, after which it should be removed.  Wrap patch in a tissue and discard in the trash. Wash hands thoroughly with soap and water. 2. You may remove the patch earlier than 72 hours if you experience unpleasant side effects which may include dry mouth, dizziness or visual disturbances. 3. Avoid touching the patch. Wash your hands with soap and water after contact with the patch.

## 2015-12-06 NOTE — Anesthesia Postprocedure Evaluation (Signed)
Anesthesia Post Note  Patient: Dana Duffy  Procedure(s) Performed: Procedure(s) (LRB): LAPAROSCOPIC CHOLECYSTECTOMY (N/A)  Patient location during evaluation: PACU Anesthesia Type: General Level of consciousness: awake and alert Pain management: pain level controlled Vital Signs Assessment: post-procedure vital signs reviewed and stable Respiratory status: spontaneous breathing, nonlabored ventilation, respiratory function stable and patient connected to nasal cannula oxygen Cardiovascular status: blood pressure returned to baseline and stable Postop Assessment: no signs of nausea or vomiting Anesthetic complications: no    Last Vitals:  Filed Vitals:   12/06/15 1059 12/06/15 1113  BP: 180/85 174/89  Pulse:  50  Temp:    Resp:      Last Pain:  Filed Vitals:   12/06/15 1133  PainSc: 3                  Myrle Dues J

## 2015-12-06 NOTE — Anesthesia Procedure Notes (Signed)
Procedure Name: Intubation Performed by: Terrance Mass Pre-anesthesia Checklist: Patient identified, Emergency Drugs available, Suction available and Patient being monitored Patient Re-evaluated:Patient Re-evaluated prior to inductionOxygen Delivery Method: Circle System Utilized Preoxygenation: Pre-oxygenation with 100% oxygen Intubation Type: IV induction Ventilation: Mask ventilation without difficulty Laryngoscope Size: Miller and 2 Grade View: Grade I Tube type: Oral Tube size: 7.0 mm Number of attempts: 1 Airway Equipment and Method: Stylet and Oral airway Placement Confirmation: ETT inserted through vocal cords under direct vision,  positive ETCO2 and breath sounds checked- equal and bilateral Secured at: 24 cm Tube secured with: Tape Dental Injury: Teeth and Oropharynx as per pre-operative assessment

## 2015-12-06 NOTE — H&P (Signed)
70 yo healthy female presents with right back pain under scapula and nausea after eating since before christmas. never had this before. has not gone away. no emesis, no fevers. she went to er due to worsening pain 2 days ago and was noted to have multiple gs on an [place]. she had normal lfts. she is here today with mild pain in her back to discuss lap chole  Other Problems  Arthritis Back Pain Bladder Problems Cholelithiasis Gastric Ulcer Gastroesophageal Reflux Disease Hemorrhoids High blood pressure Hypercholesterolemia Thyroid Disease  Past Surgical History Foot Surgery Left.  Diagnostic Studies History Colonoscopy within last year Mammogram within last year Pap Smear >5 years ago  Allergies No Known Drug Allergies  Medication History  Fiber (Oral) Active. Levothyroxine Sodium (100MCG Tablet, Oral) Active. Simvastatin (20MG  Tablet, Oral) Active. Medications Reconciled  Social History  Alcohol use Occasional alcohol use. Caffeine use Coffee, Tea. No drug use Tobacco use Never smoker.  Family History Alcohol Abuse Father, Sister, Son. Arthritis Mother, Son. Breast Cancer Family Members In General, Sister. Colon Cancer Family Members In General. Colon Polyps Brother. Depression Son. Heart Disease Family Members In General, Mother. Hypertension Mother. Prostate Cancer Family Members In General. Respiratory Condition Son.  Pregnancy / Birth History Age at menarche 22 years. Age of menopause 42-60 Gravida 4 Irregular periods Maternal age 75-35 Para 2   Review of Systems  General Present- Appetite Loss. Not Present- Chills, Fatigue, Fever, Night Sweats, Weight Gain and Weight Loss. Skin Not Present- Change in Wart/Mole, Dryness, Hives, Jaundice, New Lesions, Non-Healing Wounds, Rash and Ulcer. HEENT Present- Seasonal Allergies, Sinus Pain and Wears glasses/contact lenses. Not Present- Earache, Hearing Loss, Hoarseness,  Nose Bleed, Oral Ulcers, Ringing in the Ears, Sore Throat, Visual Disturbances and Yellow Eyes. Respiratory Not Present- Bloody sputum, Chronic Cough, Difficulty Breathing, Snoring and Wheezing. Breast Not Present- Breast Mass, Breast Pain, Nipple Discharge and Skin Changes. Cardiovascular Present- Shortness of Breath. Not Present- Chest Pain, Difficulty Breathing Lying Down, Leg Cramps, Palpitations, Rapid Heart Rate and Swelling of Extremities. Gastrointestinal Present- Difficulty Swallowing, Excessive gas, Hemorrhoids and Nausea. Not Present- Abdominal Pain, Bloating, Bloody Stool, Change in Bowel Habits, Chronic diarrhea, Constipation, Gets full quickly at meals, Indigestion, Rectal Pain and Vomiting. Female Genitourinary Present- Frequency. Not Present- Nocturia, Painful Urination, Pelvic Pain and Urgency. Musculoskeletal Present- Back Pain. Not Present- Joint Pain, Joint Stiffness, Muscle Pain, Muscle Weakness and Swelling of Extremities. Neurological Not Present- Decreased Memory, Fainting, Headaches, Numbness, Seizures, Tingling, Tremor, Trouble walking and Weakness. Psychiatric Not Present- Anxiety, Bipolar, Change in Sleep Pattern, Depression, Fearful and Frequent crying. Endocrine Not Present- Cold Intolerance, Excessive Hunger, Hair Changes, Heat Intolerance, Hot flashes and New Diabetes. Hematology Present- Easy Bruising. Not Present- Excessive bleeding, Gland problems, HIV and Persistent Infections.  Vitals  Weight: 161 lb Height: 66in Body Surface Area: 1.82 m Body Mass Index: 25.99 kg/m  Temp.: 97.30F(Temporal)  Pulse: 69 (Regular)  BP: 128/70 (Sitting, Left Arm, Standard) Physical Exam General Mental Status-Alert. Orientation-Oriented X3. Eye Sclera/Conjuntiva - Bilateral-No scleral icterus. Chest and Lung Exam Chest and lung exam reveals -on auscultation, normal breath sounds, no adventitious sounds and normal vocal  resonance. Cardiovascular Cardiovascular examination reveals -normal heart sounds, regular rate and rhythm with no murmurs. Abdomen Note: soft nt/nd bs present  Assessment & Plan  GALLSTONES (K80.20) Story: Laparoscopic cholecystectomy I discussed the procedure in detail. The patient was given Neurosurgeon. We discussed the risks and benefits of a laparoscopic cholecystectomy and possible cholangiogram including, but not limited to bleeding,  infection, injury to surrounding structures such as the intestine or liver, bile leak, retained gallstones, need to convert to an open procedure, prolonged diarrhea, blood clots such as DVT, common bile duct injury, anesthesia risks, and possible need for additional procedures. The likelihood of improvement in symptoms and return to the patient's normal status is good. We discussed the typical post-operative recovery course.

## 2015-12-06 NOTE — Op Note (Signed)
Preoperative diagnosis: biliary colic Postoperative diagnosis: saa Procedure: laparoscopic cholecystectomy  Surgeon: Dr Serita Grammes Anesthesia: general EBL: none Drains none Specimen gb and contents to pathology Complications: none Sponge count correct at completion Disposition to recovery stable  Indications: This is a 70 yo healthy female with symptomatic cholelithiasis.  We discussed proceeding with lap chole.   Procedure: After informed consent was obtained the patient was taken to the operating room. She was given antibiotics. Sequential compression devices were on her legs. She was placed under general anesthesia without complication. Her abdomen was prepped and draped in the standard sterile surgical fashion. A surgical timeout was then performed.  I infiltrated marcaine below the umbilicus. I made an incision and then entered the fascia sharply. I then entered the peritoneum bluntly. There was no evidence of an entry injury. I placed a 0 vicryl pursestring suture and inserted a hasson trocar. I then inserted 3 further 5 mm trocars in the epigastrium and ruq. there were some adhesions to the duodenum which were taken down bluntly. I was able to retract the gallbladder cephalad and lateral.  Eventually I was able to identify the cystic duct and clearly had the critical view of safety. I was able to visualize the CBD well also. I then clipped the cystic duct twice distally and once proximally. The duct was divided. The duct was viable and the clips traversed the duct.  I then clipped the cystic artery and divided it (the anterior and posterior branches were divided separately). I then removed the gallbladder from the liver bed and placed it in a bag. It was then removed from the umbilical incision. I then obtained hemostasis and irrigated.I then removed the umbilical trocar and closed with 0 vicryl and the endoclose device after tying down the pursestring. I then desufflated  the abdomen and removed all my remaining trocars. I then closed these with 4-0 Monocryl and Dermabond. She tolerated this well was extubated and transferred to the recovery room in stable condition

## 2015-12-07 ENCOUNTER — Encounter (HOSPITAL_BASED_OUTPATIENT_CLINIC_OR_DEPARTMENT_OTHER): Payer: Self-pay | Admitting: General Surgery

## 2015-12-27 ENCOUNTER — Ambulatory Visit (INDEPENDENT_AMBULATORY_CARE_PROVIDER_SITE_OTHER): Payer: Medicare Other | Admitting: Internal Medicine

## 2015-12-27 VITALS — BP 172/100 | HR 70 | Temp 97.7°F | Resp 20 | Ht 65.0 in | Wt 159.0 lb

## 2015-12-27 DIAGNOSIS — R030 Elevated blood-pressure reading, without diagnosis of hypertension: Secondary | ICD-10-CM

## 2015-12-27 DIAGNOSIS — IMO0001 Reserved for inherently not codable concepts without codable children: Secondary | ICD-10-CM

## 2015-12-27 NOTE — Progress Notes (Signed)
Subjective:  By signing my name below, I, Stann Ore, attest that this documentation has been prepared under the direction and in the presence of Ellamae Sia, MD. Electronically Signed: Stann Ore, Scribe. 12/27/2015 , 3:23 PM .  Patient was seen in Room 9 .   Patient ID: Dana Duffy, female    DOB: 1945-12-11, 69 y.o.   MRN: [id number] Chief Complaint  Patient presents with  . Follow-up    surgery    HPI Dana Duffy is a 70 y.o. female who presents to [facility] for follow up after cholecystectomy. She's doing generally well.   She's been monitoring her BP at home, up to 164/98. Today, in triage, her BP was up to 177/99. Her mother has history of HTN. She denies shortness of breath, lightheadedness, or dizziness.  She has been treated for HTN in past and had hypotens on low dose meds. Now wonders if this is related to stress of recent GB surgery plus Son in [place] w/ job and family stressors.  Patient Active Problem List   Diagnosis Date Noted  . Hammer toe of left foot 06/18/2014  . BMI 27.0-27.9,adult 06/12/2012  . Hypothyroidism 06/12/2012  . Hyperlipidemia 06/12/2012    Current outpatient prescriptions:  .  acidophilus (RISAQUAD) CAPS, Take by mouth daily., Disp: , Rfl:  .  levothyroxine (SYNTHROID) 100 MCG tablet, Take 1 tablet (100 mcg total) by mouth daily., Disp: 90 tablet, Rfl: 3 .  promethazine-codeine (PHENERGAN WITH CODEINE) 6.25-10 MG/5ML syrup, Take 5 mLs by mouth every 6 (six) hours as needed for cough., Disp: 120 mL, Rfl: 0 .  simvastatin (ZOCOR) 20 MG tablet, Take 1 tablet (20 mg total) by mouth at bedtime., Disp: 90 tablet, Rfl: 3 .  HYDROcodone-acetaminophen (NORCO) 10-325 MG tablet, Take 1 tablet by mouth every 6 (six) hours as needed. (Patient not taking: Reported on 12/27/2015), Disp: 20 tablet, Rfl: 0  Review of Systems  Constitutional: Negative for fever, chills and fatigue.  Respiratory: Negative for cough, chest tightness, shortness of breath and  wheezing.   Cardiovascular: Negative for chest pain, palpitations and leg swelling.  Gastrointestinal: Negative for nausea, vomiting, abdominal pain and diarrhea.  Neurological: Negative for dizziness, weakness, light-headedness and headaches.  doing well post op needing to be careful re diet     Objective:   Physical Exam  Constitutional: She is oriented to person, place, and time. She appears well-developed and well-nourished. No distress.  HENT:  Head: Normocephalic and atraumatic.  Eyes: EOM are normal. Pupils are equal, round, and reactive to light.  Neck: Neck supple. No thyromegaly present.  Cardiovascular: Normal rate, regular rhythm, normal heart sounds and intact distal pulses.   No murmur heard. Pulmonary/Chest: Effort normal and breath sounds normal. No respiratory distress.  Musculoskeletal: Normal range of motion.  Neurological: She is alert and oriented to person, place, and time. No cranial nerve deficit.  Skin: Skin is warm and dry.  Psychiatric: She has a normal mood and affect. Her behavior is normal.  Nursing note and vitals reviewed.  BP 172/100 mmHg  Pulse 70  Temp(Src) 97.7 F (36.5 C) (Oral)  Resp 20  Ht 5\' 5"  (1.651 m)  Wt 159 lb (72.122 kg)  BMI 26.46 kg/m2  SpO2 98%    Assessment & Plan:  I have completed the patient encounter in its entirety as documented by the scribe, with editing by me where necessary. Kendalyn Cranfield P. Merla Riches, M.D.  Elevated BP -variable for last 2 years//was nl at surgery recently. -have her  take bp daily for 2 weeks and we will decide on rx after that -Low salt diet -?stress

## 2015-12-28 ENCOUNTER — Encounter: Payer: Self-pay | Admitting: Internal Medicine

## 2016-01-03 ENCOUNTER — Encounter: Payer: Self-pay | Admitting: Internal Medicine

## 2016-01-13 ENCOUNTER — Encounter: Payer: Self-pay | Admitting: Internal Medicine

## 2016-03-30 ENCOUNTER — Other Ambulatory Visit: Payer: Self-pay | Admitting: Internal Medicine

## 2017-01-16 ENCOUNTER — Other Ambulatory Visit: Payer: Self-pay | Admitting: Dermatology

## 2017-05-25 ENCOUNTER — Encounter: Payer: Self-pay | Admitting: Physician Assistant

## 2017-05-25 ENCOUNTER — Ambulatory Visit (INDEPENDENT_AMBULATORY_CARE_PROVIDER_SITE_OTHER): Payer: Medicare Other

## 2017-05-25 ENCOUNTER — Ambulatory Visit (INDEPENDENT_AMBULATORY_CARE_PROVIDER_SITE_OTHER): Payer: Medicare Other | Admitting: Physician Assistant

## 2017-05-25 VITALS — BP 164/75 | HR 71 | Temp 97.4°F | Resp 18 | Ht 64.88 in | Wt 166.4 lb

## 2017-05-25 DIAGNOSIS — R030 Elevated blood-pressure reading, without diagnosis of hypertension: Secondary | ICD-10-CM | POA: Diagnosis not present

## 2017-05-25 DIAGNOSIS — M545 Low back pain: Secondary | ICD-10-CM

## 2017-05-25 LAB — POCT CBC
Granulocyte percent: 61.6 %G (ref 37–80)
HEMATOCRIT: 44.7 % (ref 37.7–47.9)
HEMOGLOBIN: 15.2 g/dL (ref 12.2–16.2)
LYMPH, POC: 2.3 (ref 0.6–3.4)
MCH, POC: 31.4 pg — AB (ref 27–31.2)
MCHC: 34 g/dL (ref 31.8–35.4)
MCV: 92.3 fL (ref 80–97)
MID (cbc): 0.5 (ref 0–0.9)
MPV: 7.9 fL (ref 0–99.8)
POC GRANULOCYTE: 4.5 (ref 2–6.9)
POC LYMPH %: 31.8 % (ref 10–50)
POC MID %: 6.6 %M (ref 0–12)
Platelet Count, POC: 167 10*3/uL (ref 142–424)
RBC: 4.84 M/uL (ref 4.04–5.48)
RDW, POC: 13.2 %
WBC: 7.3 10*3/uL (ref 4.6–10.2)

## 2017-05-25 LAB — POCT URINALYSIS DIP (MANUAL ENTRY)
BILIRUBIN UA: NEGATIVE
BILIRUBIN UA: NEGATIVE mg/dL
Glucose, UA: NEGATIVE mg/dL
LEUKOCYTES UA: NEGATIVE
NITRITE UA: NEGATIVE
PH UA: 5 (ref 5.0–8.0)
PROTEIN UA: NEGATIVE mg/dL
Spec Grav, UA: <=1.005 — AB (ref 1.010–1.025)
Urobilinogen, UA: 0.2 E.U./dL

## 2017-05-25 LAB — POC MICROSCOPIC URINALYSIS (UMFC): MUCUS RE: ABSENT

## 2017-05-25 MED ORDER — MELOXICAM 7.5 MG PO TABS: 7.5000 mg | ORAL_TABLET | Freq: Every day | ORAL | 0 refills | Status: DC

## 2017-05-25 NOTE — Progress Notes (Signed)
Dana Duffy  MRN: [id number] DOB: January 30, 1946  Subjective:  Dana Duffy is a 71 y.o. female seen in office today for a chief complaint of right back pain x 3 days. Initially evaluated by an urgent care 2 days ago for the same issue. She was found to have a UTI. Was started on Bactrim. Notes her urinary symptoms resolved but her back pain persists. The back pain started out behind the right shoulder blade then moved down to the flank area and will often radiate to the right abdomen area. Occasionally she will feel it on the left side as well. She has had some nausea, but notes she thinks it may be due to the Bactrim. Notes the pain is pretty consistent but sometimes it is worsened with certain movements. Of note, she did do some heavy lifting 4 days ago and thinks she could have twisted in an odd way while lifting some books. She denies hematuria, urinary frequency, hesitancy, dysuria, bladder/bowel incontinence, saddle anesthesia, numbness, and tingling. Has tried ibuprofen and this has helped. Has PSH of cholecystectomy.   Review of Systems  Constitutional: Negative for appetite change, chills, diaphoresis and fever.  Eyes: Negative for visual disturbance.  Respiratory: Negative for shortness of breath.   Cardiovascular: Negative for chest pain, palpitations and leg swelling.  Gastrointestinal: Negative for vomiting.  Musculoskeletal: Negative for gait problem.  Neurological: Negative for dizziness, light-headedness and headaches.    Patient Active Problem List   Diagnosis Date Noted  . Hammer toe of left foot 06/18/2014  . BMI 27.0-27.9,adult 06/12/2012  . Hypothyroidism 06/12/2012  . Hyperlipidemia 06/12/2012    Current Outpatient Prescriptions on File Prior to Visit  Medication Sig Dispense Refill  . acidophilus (RISAQUAD) CAPS Take by mouth daily.    Marland Kitchen levothyroxine (SYNTHROID, LEVOTHROID) 100 MCG tablet TAKE 1 TABLET ONCE DAILY. 90 tablet 0  . simvastatin (ZOCOR) 20 MG tablet  TAKE ONE TABLET AT BEDTIME. 90 tablet 0  . promethazine-codeine (PHENERGAN WITH CODEINE) 6.25-10 MG/5ML syrup Take 5 mLs by mouth every 6 (six) hours as needed for cough. (Patient not taking: Reported on 05/25/2017) 120 mL 0   No current facility-administered medications on file prior to visit.     Allergies  Allergen Reactions  . Gluten Meal Other (See Comments)    Avoids gluten   . Sulfa Antibiotics Other (See Comments)    SKIN REDNESS      Social History   Social History  . Marital status: Divorced    Spouse name: N/A  . Number of children: N/A  . Years of education: N/A   Occupational History  . Artist    Social History Main Topics  . Smoking status: Never Smoker  . Smokeless tobacco: Never Used  . Alcohol use 1.2 oz/week    2 Glasses of wine per week     Comment: occas  . Drug use: No  . Sexual activity: Not on file   Other Topics Concern  . Not on file   Social History Narrative   5 pregnancies   2 living children   2 miscarriages   Divorced.          Objective:  BP (!) 164/75 (BP Location: Right Arm, Patient Position: Sitting, Cuff Size: Normal)   Pulse 71   Temp (!) 97.4 F (36.3 C) (Oral)   Resp 18   Ht 5' 4.88" (1.648 m)   Wt 166 lb 6.4 oz (75.5 kg)   SpO2 97%   BMI 27.79 kg/m  Physical Exam  Constitutional: She is oriented to person, place, and time and well-developed, well-nourished, and in no distress.  HENT:  Head: Normocephalic and atraumatic.  Eyes: Conjunctivae are normal.  Neck: Normal range of motion.  Cardiovascular: Normal rate, regular rhythm and normal heart sounds.   Pulmonary/Chest: Effort normal and breath sounds normal.  Abdominal: Soft. Normal appearance and bowel sounds are normal. There is no tenderness. There is no rigidity, no rebound, no guarding, no CVA tenderness and no tenderness at McBurney's point.  Musculoskeletal:       Cervical back: Normal.       Thoracic back: She exhibits tenderness. She exhibits normal  range of motion and no bony tenderness.       Lumbar back: She exhibits tenderness. She exhibits normal range of motion and no bony tenderness.       Back:  Sitting up after lying on exam table provoked the back pain.   Neurological: She is alert and oriented to person, place, and time. She has normal sensation and normal strength. She has a normal Straight Leg Raise Test. Gait normal.  Reflex Scores:      Patellar reflexes are 2+ on the right side and 2+ on the left side.      Achilles reflexes are 2+ on the right side and 2+ on the left side. Skin: Skin is warm and dry.  Psychiatric: Affect normal.  Vitals reviewed.    Results for orders placed or performed in visit on 05/25/17 (from the past 24 hour(s))  POCT urinalysis dipstick     Status: Abnormal   Collection Time: 05/25/17  4:31 PM  Result Value Ref Range   Color, UA yellow yellow   Clarity, UA clear clear   Glucose, UA negative negative mg/dL   Bilirubin, UA negative negative   Ketones, POC UA negative negative mg/dL   Spec Grav, UA <=1.005 (A) 1.010 - 1.025   Blood, UA small (A) negative   pH, UA 5.0 5.0 - 8.0   Protein Ur, POC negative negative mg/dL   Urobilinogen, UA 0.2 0.2 or 1.0 E.U./dL   Nitrite, UA Negative Negative   Leukocytes, UA Negative Negative  POCT Microscopic Urinalysis ([facility])     Status: Abnormal   Collection Time: 05/25/17  4:47 PM  Result Value Ref Range   WBC,UR,HPF,POC None None WBC/hpf   RBC,UR,HPF,POC None None RBC/hpf   Bacteria None None, Too numerous to count   Mucus Absent Absent   Epithelial Cells, UR Per Microscopy Few (A) None, Too numerous to count cells/hpf  POCT CBC     Status: Abnormal   Collection Time: 05/25/17  5:42 PM  Result Value Ref Range   WBC 7.3 4.6 - 10.2 K/uL   Lymph, poc 2.3 0.6 - 3.4   POC LYMPH PERCENT 31.8 10 - 50 %L   MID (cbc) 0.5 0 - 0.9   POC MID % 6.6 0 - 12 %M   POC Granulocyte 4.5 2 - 6.9   Granulocyte percent 61.6 37 - 80 %G   RBC 4.84 4.04 - 5.48  M/uL   Hemoglobin 15.2 12.2 - 16.2 g/dL   HCT, POC 44.7 37.7 - 47.9 %   MCV 92.3 80 - 97 fL   MCH, POC 31.4 (A) 27 - 31.2 pg   MCHC 34.0 31.8 - 35.4 g/dL   RDW, POC 13.2 %   Platelet Count, POC 167 142 - 424 K/uL   MPV 7.9 0 - 99.8 fL   Dg Abd  1 View  Result Date: 05/25/2017 CLINICAL DATA:  Right flank pain radiating to the right lower quadrant of the abdomen. EXAM: ABDOMEN - 1 VIEW COMPARISON:  None. FINDINGS: Breathing motion blurring. Grossly normal bowel gas pattern. No calcified urinary tract calculi are seen. Cholecystectomy clips. Extensive lumbar spine degenerative changes and mild to moderate levoconvex scoliosis. IMPRESSION: No acute abnormality. There are no calcified urinary tract calculi. However, an abdomen and pelvis CT without contrast would be necessary to exclude noncalcified calculi, which are more common. Electronically Signed   By: Claudie Revering M.D.   On: 05/25/2017 17:45    Assessment and Plan :   1. Acute low back pain, unspecified back pain laterality, with sciatica presence unspecified DDx includes nephrolithiasis vs musculoskeletal pain. UA normal. Plain films normal. WBC wnl. This is likely musculoskeletal. Urine cx pending. Pt given Rx strength NSAID and educational material for low back stretches. Encouraged to return to clinic if symptoms worsen, do not improve, or as needed - POCT urinalysis dipstick - POCT Microscopic Urinalysis ([facility]) - Urine Culture - POCT CBC - DG Abd 1 View; Future - meloxicam (MOBIC) 7.5 MG tablet; Take 1 tablet (7.5 mg total) by mouth daily.  Dispense: 30 tablet; Refill: 0  2. Elevated blood pressure reading Asymptomatic. Instructed to check bp outside of office over the next couple of weeks. Return if consistently >140/90. Given strict ED precautions.   Tenna Delaine, PA-C  [facility] 05/27/2017 6:56 PM

## 2017-05-25 NOTE — Patient Instructions (Addendum)
Your xray, labs, and urine were reassuring today. I have sent off a urine culture for further evaluation. In the meantime, we will treat this as musculoskeletal pain, I  recommend resting today. However, tomorrow I would begin walking and moving around as much as tolerated. Begin stretching in a couple of days. The worse thing you can do for low back pain is lie in bed all day or sit down all day. I recommend using meloxicam daily for the next week.   NSAIDs like meloxicam have common side effects of heartburn, stomach pain, indigestion, and headache. Could lead to renal insufficiency, stroke, or GI bleed if taken excess amounts outside of what is recommended on label long term.    You should avoid heavy lifting or strenuous repetitive activity to prevent recurrence of event. Experiment with both ice and heat and choose whichever feels best for you.  Use heat pad or ice pack, do not apply directly to skin, use barrier such as towel over the skin. Leave on for 15-20 minutes, 3-4 times a day.  Please perform exercises below. Stretches are to be performed for 2 sets, holding 10-15 seconds each. Recommended to perform this rehab twice daily within pain tolerance for 2 weeks.  Return to clinic if symptoms worsen, do not improve with treatment, or as needed  In terms of elevated blood pressure, I would like you to check your blood pressure at least a couple times over the next week outside of the office and document these values. It is best if you check the blood pressure at different times in the day. Your goal is <140/90. If your values are consistently above this goal, please return to office for further evaluation. If you start to have chest pain, blurred vision, shortness of breath, severe headache, lower leg swelling, or nausea/vomiting please seek care immediately here or at the ED.     FLEXION RANGE OF MOTION AND STRETCHING EXERCISES: STRETCH - Flexion, Single Knee to Chest   Lie on a firm bed or  floor with both legs extended in front of you.  Keeping one leg in contact with the floor, bring your opposite knee to your chest. Hold your leg in place by either grabbing behind your thigh or at your knee.  Pull until you feel a gentle stretch in your lower back.   Slowly release your grasp and repeat the exercise with the opposite side.  STRETCH - Flexion, Double Knee to Chest   Lie on a firm bed or floor with both legs extended in front of you.  Keeping one leg in contact with the floor, bring your opposite knee to your chest.  Tense your stomach muscles to support your back and then lift your other knee to your chest. Hold your legs in place by either grabbing behind your thighs or at your knees.  Pull both knees toward your chest until you feel a gentle stretch in your lower back.   Tense your stomach muscles and slowly return one leg at a time to the floor.  STRETCH - Low Trunk Rotation  Lie on a firm bed or floor. Keeping your legs in front of you, bend your knees so they are both pointed toward the ceiling and your feet are flat on the floor.  Extend your arms out to the side. This will stabilize your upper body by keeping your shoulders in contact with the floor.  Gently and slowly drop both knees together to one side until you feel a  gentle stretch in your lower back.   Tense your stomach muscles to support your lower back as you bring your knees back to the starting position. Repeat the exercise to the other side.   EXTENSION RANGE OF MOTION AND FLEXIBILITY EXERCISES: STRETCH - Extension, Prone on Elbows   Lie on your stomach on the floor, a bed will be too soft. Place your palms about shoulder width apart and at the height of your head.  Place your elbows under your shoulders. If this is too painful, stack pillows under your chest.  Allow your body to relax so that your hips drop lower and make contact more completely with the floor.  Slowly return to lying flat on  the floor.  RANGE OF MOTION - Extension, Prone Press Ups  Lie on your stomach on the floor, a bed will be too soft. Place your palms about shoulder width apart and at the height of your head.  Keeping your back as relaxed as possible, slowly straighten your elbows while keeping your hips on the floor. You may adjust the placement of your hands to maximize your comfort. As you gain motion, your hands will come more underneath your shoulders.  Slowly return to lying flat on the floor.  RANGE OF MOTION- Quadruped, Neutral Spine   Assume a hands and knees position on a firm surface. Keep your hands under your shoulders and your knees under your hips. You may place padding under your knees for comfort.  Drop your head and point your tail bone toward the ground below you. This will round out your lower back like an angry cat.    Slowly lift your head and release your tail bone so that your back sags into a large arch, like an old horse.  Repeat this until you feel limber in your lower back.  Now, find your "sweet spot." This will be the most comfortable position somewhere between the two previous positions. This is your neutral spine. Once you have found this position, tense your stomach muscles to support your lower back.  STRENGTHENING EXERCISES - Low Back Strain These exercises may help you when beginning to rehabilitate your injury. These exercises should be done near your "sweet spot." This is the neutral, low-back arch, somewhere between fully rounded and fully arched, that is your least painful position. When performed in this safe range of motion, these exercises can be used for people who have either a flexion or extension based injury. These exercises may resolve your symptoms with or without further involvement from your physician, physical therapist or athletic trainer. While completing these exercises, remember:   Muscles can gain both the endurance and the strength needed for  everyday activities through controlled exercises.  Complete these exercises as instructed by your physician, physical therapist or athletic trainer. Increase the resistance and repetitions only as guided.  You may experience muscle soreness or fatigue, but the pain or discomfort you are trying to eliminate should never worsen during these exercises. If this pain does worsen, stop and make certain you are following the directions exactly. If the pain is still present after adjustments, discontinue the exercise until you can discuss the trouble with your caregiver.  STRENGTHENING - Deep Abdominals, Pelvic Tilt  Lie on a firm bed or floor. Keeping your legs in front of you, bend your knees so they are both pointed toward the ceiling and your feet are flat on the floor.  Tense your lower abdominal muscles to press your lower back  into the floor. This motion will rotate your pelvis so that your tail bone is scooping upwards rather than pointing at your feet or into the floor.  STRENGTHENING - Abdominals, Crunches   Lie on a firm bed or floor. Keeping your legs in front of you, bend your knees so they are both pointed toward the ceiling and your feet are flat on the floor. Cross your arms over your chest.  Slightly tip your chin down without bending your neck.  Tense your abdominals and slowly lift your trunk high enough to just clear your shoulder blades. Lifting higher can put excessive stress on the lower back and does not further strengthen your abdominal muscles.  Control your return to the starting position.  STRENGTHENING - Quadruped, Opposite UE/LE Lift   Assume a hands and knees position on a firm surface. Keep your hands under your shoulders and your knees under your hips. You may place padding under your knees for comfort.  Find your neutral spine and gently tense your abdominal muscles so that you can maintain this position. Your shoulders and hips should form a rectangle that is  parallel with the floor and is not twisted.  Keeping your trunk steady, lift your right hand no higher than your shoulder and then your left leg no higher than your hip. Make sure you are not holding your breath.   Continuing to keep your abdominal muscles tense and your back steady, slowly return to your starting position. Repeat with the opposite arm and leg.  STRENGTHENING - Lower Abdominals, Double Knee Lift  Lie on a firm bed or floor. Keeping your legs in front of you, bend your knees so they are both pointed toward the ceiling and your feet are flat on the floor.  Tense your abdominal muscles to brace your lower back and slowly lift both of your knees until they come over your hips. Be certain not to hold your breath.  POSTURE AND BODY MECHANICS CONSIDERATIONS - Low Back Strain Keeping correct posture when sitting, standing or completing your activities will reduce the stress put on different body tissues, allowing injured tissues a chance to heal and limiting painful experiences. The following are general guidelines for improved posture. Your physician or physical therapist will provide you with any instructions specific to your needs. While reading these guidelines, remember:  The exercises prescribed by your provider will help you have the flexibility and strength to maintain correct postures.  The correct posture provides the best environment for your joints to work. All of your joints have less wear and tear when properly supported by a spine with good posture. This means you will experience a healthier, less painful body.  Correct posture must be practiced with all of your activities, especially prolonged sitting and standing. Correct posture is as important when doing repetitive low-stress activities (typing) as it is when doing a single heavy-load activity (lifting). RESTING POSITIONS Consider which positions are most painful for you when choosing a resting position. If you have  pain with flexion-based activities (sitting, bending, stooping, squatting), choose a position that allows you to rest in a less flexed posture. You would want to avoid curling into a fetal position on your side. If your pain worsens with extension-based activities (prolonged standing, working overhead), avoid resting in an extended position such as sleeping on your stomach. Most people will find more comfort when they rest with their spine in a more neutral position, neither too rounded nor too arched. Lying on a  non-sagging bed on your side with a pillow between your knees, or on your back with a pillow under your knees will often provide some relief. Keep in mind, being in any one position for a prolonged period of time, no matter how correct your posture, can still lead to stiffness. PROPER SITTING POSTURE In order to minimize stress and discomfort on your spine, you must sit with correct posture. Sitting with good posture should be effortless for a healthy body. Returning to good posture is a gradual process. Many people can work toward this most comfortably by using various supports until they have the flexibility and strength to maintain this posture on their own. When sitting with proper posture, your ears will fall over your shoulders and your shoulders will fall over your hips. You should use the back of the chair to support your upper back. Your lower back will be in a neutral position, just slightly arched. You may place a small pillow or folded towel at the base of your lower back for support.  When working at a desk, create an environment that supports good, upright posture. Without extra support, muscles tire, which leads to excessive strain on joints and other tissues. Keep these recommendations in mind: CHAIR:  A chair should be able to slide under your desk when your back makes contact with the back of the chair. This allows you to work closely.  The chair's height should allow your eyes to  be level with the upper part of your monitor and your hands to be slightly lower than your elbows. BODY POSITION  Your feet should make contact with the floor. If this is not possible, use a foot rest.  Keep your ears over your shoulders. This will reduce stress on your neck and lower back. INCORRECT SITTING POSTURES  If you are feeling tired and unable to assume a healthy sitting posture, do not slouch or slump. This puts excessive strain on your back tissues, causing more damage and pain. Healthier options include:  Using more support, like a lumbar pillow.  Switching tasks to something that requires you to be upright or walking.  Talking a brief walk.  Lying down to rest in a neutral-spine position. PROLONGED STANDING WHILE SLIGHTLY LEANING FORWARD  When completing a task that requires you to lean forward while standing in one place for a long time, place either foot up on a stationary 2-4 inch high object to help maintain the best posture. When both feet are on the ground, the lower back tends to lose its slight inward curve. If this curve flattens (or becomes too large), then the back and your other joints will experience too much stress, tire more quickly, and can cause pain. CORRECT STANDING POSTURES Proper standing posture should be assumed with all daily activities, even if they only take a few moments, like when brushing your teeth. As in sitting, your ears should fall over your shoulders and your shoulders should fall over your hips. You should keep a slight tension in your abdominal muscles to brace your spine. Your tailbone should point down to the ground, not behind your body, resulting in an over-extended swayback posture.  INCORRECT STANDING POSTURES  Common incorrect standing postures include a forward head, locked knees and/or an excessive swayback. WALKING Walk with an upright posture. Your ears, shoulders and hips should all line-up. PROLONGED ACTIVITY IN A FLEXED  POSITION When completing a task that requires you to bend forward at your waist or lean over a  low surface, try to find a way to stabilize 3 out of 4 of your limbs. You can place a hand or elbow on your thigh or rest a knee on the surface you are reaching across. This will provide you more stability so that your muscles do not fatigue as quickly. By keeping your knees relaxed, or slightly bent, you will also reduce stress across your lower back. CORRECT LIFTING TECHNIQUES DO :   Assume a wide stance. This will provide you more stability and the opportunity to get as close as possible to the object which you are lifting.  Tense your abdominals to brace your spine. Bend at the knees and hips. Keeping your back locked in a neutral-spine position, lift using your leg muscles. Lift with your legs, keeping your back straight.  Test the weight of unknown objects before attempting to lift them.  Try to keep your elbows locked down at your sides in order get the best strength from your shoulders when carrying an object.  Always ask for help when lifting heavy or awkward objects. INCORRECT LIFTING TECHNIQUES DO NOT:   Lock your knees when lifting, even if it is a small object.  Bend and twist. Pivot at your feet or move your feet when needing to change directions.  Assume that you can safely pick up even a paper clip without proper posture.      IF you received an x-ray today, you will receive an invoice from [facility]. Please contact [facility] at [phone] with questions or concerns regarding your invoice.   IF you received labwork today, you will receive an invoice from [place]. Please contact [facility] at [phone] with questions or concerns regarding your invoice.   Our billing staff will not be able to assist you with questions regarding bills from these companies.  You will be contacted with the lab results as soon as they are available. The fastest way to get  your results is to activate your My Chart account. Instructions are located on the last page of this paperwork. If you have not heard from [place] regarding the results in 2 weeks, please contact this office.

## 2017-05-26 LAB — URINE CULTURE: Organism ID, Bacteria: NO GROWTH

## 2017-06-12 ENCOUNTER — Encounter (HOSPITAL_COMMUNITY)
Admission: EM | Disposition: A | Discharge status: Home / Self Care | Payer: Self-pay | Source: Home / Self Care | Attending: Emergency Medicine

## 2017-06-12 ENCOUNTER — Emergency Department (HOSPITAL_COMMUNITY): Payer: Medicare Other

## 2017-06-12 ENCOUNTER — Emergency Department (HOSPITAL_COMMUNITY): Payer: Medicare Other | Admitting: Certified Registered Nurse Anesthetist

## 2017-06-12 ENCOUNTER — Observation Stay (HOSPITAL_COMMUNITY)
Admission: EM | Admit: 2017-06-12 | Discharge: 2017-06-13 | Disposition: A | Discharge status: Home / Self Care | Payer: Medicare Other | Attending: Surgery | Admitting: Surgery

## 2017-06-12 ENCOUNTER — Encounter (HOSPITAL_COMMUNITY): Payer: Self-pay | Admitting: Emergency Medicine

## 2017-06-12 DIAGNOSIS — N390 Urinary tract infection, site not specified: Secondary | ICD-10-CM

## 2017-06-12 DIAGNOSIS — R1031 Right lower quadrant pain: Secondary | ICD-10-CM

## 2017-06-12 DIAGNOSIS — R1030 Lower abdominal pain, unspecified: Secondary | ICD-10-CM

## 2017-06-12 DIAGNOSIS — Z79899 Other long term (current) drug therapy: Secondary | ICD-10-CM | POA: Diagnosis not present

## 2017-06-12 DIAGNOSIS — R319 Hematuria, unspecified: Secondary | ICD-10-CM

## 2017-06-12 DIAGNOSIS — E7800 Pure hypercholesterolemia, unspecified: Secondary | ICD-10-CM | POA: Diagnosis not present

## 2017-06-12 DIAGNOSIS — D373 Neoplasm of uncertain behavior of appendix: Principal | ICD-10-CM | POA: Insufficient documentation

## 2017-06-12 DIAGNOSIS — R109 Unspecified abdominal pain: Secondary | ICD-10-CM | POA: Diagnosis present

## 2017-06-12 DIAGNOSIS — I119 Hypertensive heart disease without heart failure: Secondary | ICD-10-CM | POA: Diagnosis not present

## 2017-06-12 DIAGNOSIS — E039 Hypothyroidism, unspecified: Secondary | ICD-10-CM | POA: Insufficient documentation

## 2017-06-12 HISTORY — PX: LAPAROSCOPIC APPENDECTOMY: SHX408

## 2017-06-12 LAB — COMPREHENSIVE METABOLIC PANEL
ALT: 14 U/L (ref 14–54)
ANION GAP: 10 (ref 5–15)
AST: 25 U/L (ref 15–41)
Albumin: 3.9 g/dL (ref 3.5–5.0)
Alkaline Phosphatase: 76 U/L (ref 38–126)
BUN: 12 mg/dL (ref 6–20)
CHLORIDE: 104 mmol/L (ref 101–111)
CO2: 24 mmol/L (ref 22–32)
Calcium: 9.3 mg/dL (ref 8.9–10.3)
Creatinine, Ser: 0.66 mg/dL (ref 0.44–1.00)
Glucose, Bld: 109 mg/dL — ABNORMAL HIGH (ref 65–99)
POTASSIUM: 3.6 mmol/L (ref 3.5–5.1)
Sodium: 138 mmol/L (ref 135–145)
Total Bilirubin: 1.6 mg/dL — ABNORMAL HIGH (ref 0.3–1.2)
Total Protein: 6.7 g/dL (ref 6.5–8.1)

## 2017-06-12 LAB — URINALYSIS, ROUTINE W REFLEX MICROSCOPIC
BILIRUBIN URINE: NEGATIVE
GLUCOSE, UA: NEGATIVE mg/dL
Ketones, ur: NEGATIVE mg/dL
Nitrite: NEGATIVE
PH: 5 (ref 5.0–8.0)
Protein, ur: NEGATIVE mg/dL
SPECIFIC GRAVITY, URINE: 1.009 (ref 1.005–1.030)

## 2017-06-12 LAB — CBC
HEMATOCRIT: 40.7 % (ref 36.0–46.0)
Hemoglobin: 13.8 g/dL (ref 12.0–15.0)
MCH: 31.8 pg (ref 26.0–34.0)
MCHC: 33.9 g/dL (ref 30.0–36.0)
MCV: 93.8 fL (ref 78.0–100.0)
PLATELETS: 163 10*3/uL (ref 150–400)
RBC: 4.34 MIL/uL (ref 3.87–5.11)
RDW: 12.9 % (ref 11.5–15.5)
WBC: 9.2 10*3/uL (ref 4.0–10.5)

## 2017-06-12 LAB — LIPASE, BLOOD: LIPASE: 24 U/L (ref 11–51)

## 2017-06-12 LAB — I-STAT CG4 LACTIC ACID, ED
Lactic Acid, Venous: 1.06 mmol/L (ref 0.5–1.9)
Lactic Acid, Venous: 1.06 mmol/L (ref 0.5–1.9)

## 2017-06-12 SURGERY — APPENDECTOMY, LAPAROSCOPIC
Anesthesia: General | Site: Abdomen

## 2017-06-12 MED ORDER — KCL IN DEXTROSE-NACL 20-5-0.45 MEQ/L-%-% IV SOLN
INTRAVENOUS | Status: DC
  Administered 2017-06-12: 1000 mL via INTRAVENOUS
  Administered 2017-06-13: 03:00:00 via INTRAVENOUS
  Filled 2017-06-12 (×2): qty 1000

## 2017-06-12 MED ORDER — 0.9 % SODIUM CHLORIDE (POUR BTL) OPTIME
TOPICAL | Status: DC | PRN
  Administered 2017-06-12: 1000 mL

## 2017-06-12 MED ORDER — ONDANSETRON HCL 4 MG/2ML IJ SOLN: 4.0000 mg | Freq: Four times a day (QID) | INTRAMUSCULAR | Status: DC | PRN

## 2017-06-12 MED ORDER — HYDROCODONE-ACETAMINOPHEN 5-325 MG PO TABS: 1.0000 | ORAL_TABLET | ORAL | Status: DC | PRN

## 2017-06-12 MED ORDER — IOPAMIDOL (ISOVUE-300) INJECTION 61%
100.0000 mL | Freq: Once | INTRAVENOUS | Status: AC | PRN
  Administered 2017-06-12: 100 mL via INTRAVENOUS

## 2017-06-12 MED ORDER — DEXTROSE 5 % IV SOLN
2.0000 g | INTRAVENOUS | Status: AC
  Administered 2017-06-12: 2 g via INTRAVENOUS
  Filled 2017-06-12: qty 2

## 2017-06-12 MED ORDER — HEPARIN SODIUM (PORCINE) 5000 UNIT/ML IJ SOLN
5000.0000 [IU] | Freq: Three times a day (TID) | INTRAMUSCULAR | Status: DC
  Administered 2017-06-12 – 2017-06-13 (×3): 5000 [IU] via SUBCUTANEOUS
  Filled 2017-06-12 (×2): qty 1

## 2017-06-12 MED ORDER — LACTATED RINGERS IV SOLN
INTRAVENOUS | Status: DC
  Administered 2017-06-12: 1000 mL via INTRAVENOUS

## 2017-06-12 MED ORDER — MORPHINE SULFATE (PF) 2 MG/ML IV SOLN
1.0000 mg | INTRAVENOUS | Status: DC | PRN
  Administered 2017-06-12: 2 mg via INTRAVENOUS
  Filled 2017-06-12: qty 1

## 2017-06-12 MED ORDER — IOPAMIDOL (ISOVUE-300) INJECTION 61%
INTRAVENOUS | Status: AC
  Administered 2017-06-12: 100 mL via INTRAVENOUS
  Filled 2017-06-12: qty 100

## 2017-06-12 MED ORDER — ONDANSETRON 4 MG PO TBDP: 4.0000 mg | ORAL_TABLET | Freq: Four times a day (QID) | ORAL | Status: DC | PRN

## 2017-06-12 MED ORDER — EPHEDRINE SULFATE 50 MG/ML IJ SOLN
INTRAMUSCULAR | Status: DC | PRN
  Administered 2017-06-12: 10 mg via INTRAVENOUS

## 2017-06-12 MED ORDER — EPHEDRINE 5 MG/ML INJ
INTRAVENOUS | Status: AC
  Filled 2017-06-12: qty 10

## 2017-06-12 MED ORDER — FENTANYL CITRATE (PF) 250 MCG/5ML IJ SOLN
INTRAMUSCULAR | Status: AC
  Filled 2017-06-12: qty 5

## 2017-06-12 MED ORDER — LIDOCAINE 2% (20 MG/ML) 5 ML SYRINGE
INTRAMUSCULAR | Status: AC
  Filled 2017-06-12: qty 5

## 2017-06-12 MED ORDER — PROPOFOL 10 MG/ML IV BOLUS
INTRAVENOUS | Status: DC | PRN
  Administered 2017-06-12: 150 mg via INTRAVENOUS

## 2017-06-12 MED ORDER — FENTANYL CITRATE (PF) 100 MCG/2ML IJ SOLN: 25.0000 ug | INTRAMUSCULAR | Status: DC | PRN

## 2017-06-12 MED ORDER — ROCURONIUM BROMIDE 50 MG/5ML IV SOSY
PREFILLED_SYRINGE | INTRAVENOUS | Status: AC
  Filled 2017-06-12: qty 5

## 2017-06-12 MED ORDER — DEXAMETHASONE SODIUM PHOSPHATE 10 MG/ML IJ SOLN
INTRAMUSCULAR | Status: DC | PRN
  Administered 2017-06-12: 10 mg via INTRAVENOUS

## 2017-06-12 MED ORDER — PROMETHAZINE HCL 25 MG/ML IJ SOLN: 6.2500 mg | INTRAMUSCULAR | Status: DC | PRN

## 2017-06-12 MED ORDER — LIDOCAINE 2% (20 MG/ML) 5 ML SYRINGE
INTRAMUSCULAR | Status: DC | PRN
  Administered 2017-06-12: 60 mg via INTRAVENOUS

## 2017-06-12 MED ORDER — MORPHINE SULFATE (PF) 2 MG/ML IV SOLN
4.0000 mg | Freq: Once | INTRAVENOUS | Status: AC
  Administered 2017-06-12: 2 mg via INTRAVENOUS
  Filled 2017-06-12: qty 2

## 2017-06-12 MED ORDER — PROPOFOL 10 MG/ML IV BOLUS
INTRAVENOUS | Status: AC
  Filled 2017-06-12: qty 20

## 2017-06-12 MED ORDER — BUPIVACAINE-EPINEPHRINE 0.25% -1:200000 IJ SOLN
INTRAMUSCULAR | Status: DC | PRN
  Administered 2017-06-12: 30 mL

## 2017-06-12 MED ORDER — ONDANSETRON HCL 4 MG/2ML IJ SOLN
INTRAMUSCULAR | Status: DC | PRN
  Administered 2017-06-12: 4 mg via INTRAVENOUS

## 2017-06-12 MED ORDER — DEXAMETHASONE SODIUM PHOSPHATE 10 MG/ML IJ SOLN
INTRAMUSCULAR | Status: AC
  Filled 2017-06-12: qty 1

## 2017-06-12 MED ORDER — LEVOTHYROXINE SODIUM 100 MCG PO TABS
100.0000 ug | ORAL_TABLET | Freq: Every day | ORAL | Status: DC
  Administered 2017-06-13: 100 ug via ORAL
  Filled 2017-06-12: qty 1

## 2017-06-12 MED ORDER — DEXTROSE 5 % IV SOLN: 2.0000 g | Freq: Once | INTRAVENOUS | Status: DC

## 2017-06-12 MED ORDER — LACTATED RINGERS IR SOLN
Status: DC | PRN
  Administered 2017-06-12: 1000 mL

## 2017-06-12 MED ORDER — ONDANSETRON 4 MG PO TBDP
4.0000 mg | ORAL_TABLET | Freq: Once | ORAL | Status: AC | PRN
Start: 2017-06-12 — End: 2017-06-12
  Administered 2017-06-12: 4 mg via ORAL
  Filled 2017-06-12: qty 1

## 2017-06-12 MED ORDER — FENTANYL CITRATE (PF) 100 MCG/2ML IJ SOLN
INTRAMUSCULAR | Status: DC | PRN
  Administered 2017-06-12 (×5): 50 ug via INTRAVENOUS

## 2017-06-12 MED ORDER — ROCURONIUM BROMIDE 50 MG/5ML IV SOSY
PREFILLED_SYRINGE | INTRAVENOUS | Status: DC | PRN
  Administered 2017-06-12: 35 mg via INTRAVENOUS

## 2017-06-12 MED ORDER — SUCCINYLCHOLINE CHLORIDE 200 MG/10ML IV SOSY
PREFILLED_SYRINGE | INTRAVENOUS | Status: DC | PRN
  Administered 2017-06-12: 100 mg via INTRAVENOUS

## 2017-06-12 MED ORDER — SUCCINYLCHOLINE CHLORIDE 200 MG/10ML IV SOSY
PREFILLED_SYRINGE | INTRAVENOUS | Status: AC
  Filled 2017-06-12: qty 10

## 2017-06-12 MED ORDER — ONDANSETRON HCL 4 MG/2ML IJ SOLN
INTRAMUSCULAR | Status: AC
  Filled 2017-06-12: qty 2

## 2017-06-12 MED ORDER — GABAPENTIN 300 MG PO CAPS
300.0000 mg | ORAL_CAPSULE | Freq: Two times a day (BID) | ORAL | Status: DC
  Administered 2017-06-12 – 2017-06-13 (×3): 300 mg via ORAL
  Filled 2017-06-12 (×3): qty 1

## 2017-06-12 MED ORDER — CEFTRIAXONE SODIUM 1 G IJ SOLR
1.0000 g | Freq: Once | INTRAMUSCULAR | Status: AC
  Administered 2017-06-12: 1 g via INTRAVENOUS
  Filled 2017-06-12: qty 10

## 2017-06-12 MED ORDER — SUGAMMADEX SODIUM 200 MG/2ML IV SOLN
INTRAVENOUS | Status: AC
  Filled 2017-06-12: qty 2

## 2017-06-12 MED ORDER — BUPIVACAINE-EPINEPHRINE (PF) 0.25% -1:200000 IJ SOLN
INTRAMUSCULAR | Status: AC
  Filled 2017-06-12: qty 30

## 2017-06-12 MED ORDER — SUGAMMADEX SODIUM 200 MG/2ML IV SOLN
INTRAVENOUS | Status: DC | PRN
  Administered 2017-06-12: 200 mg via INTRAVENOUS

## 2017-06-12 SURGICAL SUPPLY — 34 items
APPLIER CLIP ROT 10 11.4 M/L (STAPLE)
BENZOIN TINCTURE PRP APPL 2/3 (GAUZE/BANDAGES/DRESSINGS) IMPLANT
CABLE HIGH FREQUENCY MONO STRZ (ELECTRODE) ×2 IMPLANT
CHLORAPREP W/TINT 26ML (MISCELLANEOUS) ×2 IMPLANT
CLIP APPLIE ROT 10 11.4 M/L (STAPLE) IMPLANT
COVER SURGICAL LIGHT HANDLE (MISCELLANEOUS) ×2 IMPLANT
CUTTER FLEX LINEAR 45M (STAPLE) ×2 IMPLANT
DECANTER SPIKE VIAL GLASS SM (MISCELLANEOUS) ×2 IMPLANT
DERMABOND ADVANCED (GAUZE/BANDAGES/DRESSINGS) ×1
DERMABOND ADVANCED .7 DNX12 (GAUZE/BANDAGES/DRESSINGS) ×1 IMPLANT
DRAPE LAPAROSCOPIC ABDOMINAL (DRAPES) ×2 IMPLANT
ELECT REM PT RETURN 15FT ADLT (MISCELLANEOUS) ×2 IMPLANT
ENDOLOOP SUT PDS II  0 18 (SUTURE)
ENDOLOOP SUT PDS II 0 18 (SUTURE) IMPLANT
GLOVE SURG SIGNA 7.5 PF LTX (GLOVE) ×2 IMPLANT
GOWN STRL REUS W/TWL XL LVL3 (GOWN DISPOSABLE) ×4 IMPLANT
KIT BASIN OR (CUSTOM PROCEDURE TRAY) ×2 IMPLANT
POUCH SPECIMEN RETRIEVAL 10MM (ENDOMECHANICALS) ×2 IMPLANT
RELOAD 45 VASCULAR/THIN (ENDOMECHANICALS) IMPLANT
RELOAD STAPLE TA45 3.5 REG BLU (ENDOMECHANICALS) ×2 IMPLANT
SCISSORS LAP 5X35 DISP (ENDOMECHANICALS) ×2 IMPLANT
SET IRRIG TUBING LAPAROSCOPIC (IRRIGATION / IRRIGATOR) ×2 IMPLANT
SHEARS HARMONIC ACE PLUS 36CM (ENDOMECHANICALS) ×2 IMPLANT
SLEEVE XCEL OPT CAN 5 100 (ENDOMECHANICALS) ×2 IMPLANT
STRIP CLOSURE SKIN 1/2X4 (GAUZE/BANDAGES/DRESSINGS) IMPLANT
SUT MNCRL AB 4-0 PS2 18 (SUTURE) ×2 IMPLANT
SUT VIC AB 2-0 SH 18 (SUTURE) IMPLANT
SUT VIC AB 2-0 SH 27 (SUTURE) ×1
SUT VIC AB 2-0 SH 27X BRD (SUTURE) ×1 IMPLANT
TOWEL OR 17X26 10 PK STRL BLUE (TOWEL DISPOSABLE) ×2 IMPLANT
TOWEL OR NON WOVEN STRL DISP B (DISPOSABLE) ×2 IMPLANT
TRAY LAPAROSCOPIC (CUSTOM PROCEDURE TRAY) ×2 IMPLANT
TROCAR BLADELESS OPT 5 100 (ENDOMECHANICALS) ×2 IMPLANT
TROCAR XCEL BLUNT TIP 100MML (ENDOMECHANICALS) ×2 IMPLANT

## 2017-06-12 NOTE — ED Provider Notes (Signed)
[facility] DEPT Provider Note   CSN: [id number] Arrival date & time: 06/12/17  0136     History   Chief Complaint Chief Complaint  Patient presents with  . Abdominal Pain    HPI Dana Duffy is a 71 y.o. female with a hx of Allergy, anemia, arthritis, hypothyroidism presents to the Emergency Department complaining of gradual, persistent, progressively worsening generalized abdominal pain onset approximately 3 weeks ago, but becoming more focal last night. Patient reports tonight she has significant lower abdominal pain. She reports associated nausea and gas but denies vomiting. She denies fevers at home. Patient reports she's also had significant low back pain. She reports she's been evaluated for this 3 at an urgent care center. She reports pain films of her low back showed right is. She also reports being treated for urinary tract infection without relief of her pain. Patient denies history of cancer. She denies fevers or chills, vomiting, diarrhea, weakness and dizziness, syncope, dysuria, hematuria, chest pain, shortness of breath.  Nothing seems to make her symptoms better or worse. She reports she is unable to find a comfortable position.  The history is provided by the patient and medical records. No language interpreter was used.    Past Medical History:  Diagnosis Date  . Allergy   . Anemia   . Arthritis   . Cholelithiases   . Hypothyroidism   . Thyroid disease     Patient Active Problem List   Diagnosis Date Noted  . Hammer toe of left foot 06/18/2014  . BMI 27.0-27.9,adult 06/12/2012  . Hypothyroidism 06/12/2012  . Hyperlipidemia 06/12/2012    Past Surgical History:  Procedure Laterality Date  . BUNIONECTOMY    . CHOLECYSTECTOMY N/A 12/06/2015   Procedure: LAPAROSCOPIC CHOLECYSTECTOMY;  Surgeon: Rolm Bookbinder, MD;  Location: [facility];  Service: General;  Laterality: N/A;    OB History    No data available       Home Medications     Prior to Admission medications   Medication Sig Start Date End Date Taking? Authorizing Provider  acidophilus (RISAQUAD) CAPS Take 1 capsule by mouth daily.    Yes [provider]  cholecalciferol (VITAMIN D) 1000 units tablet Take 1,000 Units by mouth daily.   Yes [provider]  levothyroxine (SYNTHROID, LEVOTHROID) 100 MCG tablet TAKE 1 TABLET ONCE DAILY. 03/30/16  Yes Leandrew Koyanagi, MD  losartan-hydrochlorothiazide (HYZAAR) 50-12.5 MG tablet Take 0.5 tablets by mouth daily.  04/22/17  Yes [provider]  simvastatin (ZOCOR) 20 MG tablet TAKE ONE TABLET AT BEDTIME. 03/30/16  Yes Leandrew Koyanagi, MD  meloxicam (MOBIC) 7.5 MG tablet Take 1 tablet (7.5 mg total) by mouth daily. Patient not taking: Reported on 06/12/2017 05/25/17   Leonie Douglas, PA-C  promethazine-codeine [facility] WITH CODEINE) 6.25-10 MG/5ML syrup Take 5 mLs by mouth every 6 (six) hours as needed for cough. Patient not taking: Reported on 05/25/2017 10/29/15   Leandrew Koyanagi, MD    Family History Family History  Problem Relation Age of Onset  . Heart disease Mother   . Hyperlipidemia Mother   . Hypertension Mother   . Asthma Son   . Depression Maternal Grandmother   . Hyperlipidemia Maternal Grandmother   . Diabetes Father   . Cancer Sister     Social History Social History  Substance Use Topics  . Smoking status: Never Smoker  . Smokeless tobacco: Never Used  . Alcohol use 1.2 oz/week    2 Glasses of wine  per week     Comment: occas     Allergies   Gluten meal and Sulfa antibiotics   Review of Systems Review of Systems  Constitutional: Negative for appetite change, diaphoresis, fatigue, fever and unexpected weight change.  HENT: Negative for mouth sores.   Eyes: Negative for visual disturbance.  Respiratory: Negative for cough, chest tightness, shortness of breath and wheezing.   Cardiovascular: Negative for chest pain.  Gastrointestinal: Positive for  abdominal pain. Negative for constipation, diarrhea, nausea and vomiting.  Endocrine: Negative for polydipsia, polyphagia and polyuria.  Genitourinary: Negative for dysuria, frequency, hematuria and urgency.  Musculoskeletal: Positive for back pain. Negative for neck stiffness.  Skin: Negative for rash.  Allergic/Immunologic: Negative for immunocompromised state.  Neurological: Negative for syncope, light-headedness and headaches.  Hematological: Does not bruise/bleed easily.  Psychiatric/Behavioral: Negative for sleep disturbance. The patient is not nervous/anxious.   All other systems reviewed and are negative.    Physical Exam Updated Vital Signs BP (!) 145/69 (BP Location: Left Arm)   Pulse (!) 59   Temp 98.4 F (36.9 C) (Oral)   Resp 17   Ht 5\' 6"  (1.676 m)   Wt 75.8 kg (167 lb)   SpO2 96%   BMI 26.95 kg/m   Physical Exam  Constitutional: She appears well-developed and well-nourished. No distress.  Awake, alert, nontoxic appearance  HENT:  Head: Normocephalic and atraumatic.  Mouth/Throat: Oropharynx is clear and moist. No oropharyngeal exudate.  Eyes: Conjunctivae are normal. No scleral icterus.  Neck: Normal range of motion. Neck supple.  Cardiovascular: Normal rate, regular rhythm and intact distal pulses.   Pulmonary/Chest: Effort normal and breath sounds normal. No respiratory distress. She has no wheezes.  Equal chest expansion  Abdominal: Soft. Bowel sounds are normal. She exhibits no mass. There is tenderness in the right lower quadrant and suprapubic area. There is guarding ( mild). There is no rigidity, no rebound and no CVA tenderness.  Musculoskeletal: Normal range of motion. She exhibits no edema.  Neurological: She is alert.  Speech is clear and goal oriented Moves extremities without ataxia  Skin: Skin is warm and dry. She is not diaphoretic.  Psychiatric: She has a normal mood and affect.  Nursing note and vitals reviewed.    ED Treatments /  Results  Labs (all labs ordered are listed, but only abnormal results are displayed) Labs Reviewed  COMPREHENSIVE METABOLIC PANEL - Abnormal; Notable for the following:       Result Value   Glucose, Bld 109 (*)    Total Bilirubin 1.6 (*)    All other components within normal limits  URINALYSIS, ROUTINE W REFLEX MICROSCOPIC - Abnormal; Notable for the following:    APPearance HAZY (*)    Hgb urine dipstick MODERATE (*)    Leukocytes, UA LARGE (*)    Bacteria, UA RARE (*)    Squamous Epithelial / LPF 6-30 (*)    Non Squamous Epithelial 0-5 (*)    All other components within normal limits  URINE CULTURE  LIPASE, BLOOD  CBC  I-STAT CG4 LACTIC ACID, ED    Procedures Procedures (including critical care time)  Medications Ordered in ED Medications  cefTRIAXone (ROCEPHIN) 1 g in dextrose 5 % 50 mL IVPB (not administered)  ondansetron (ZOFRAN-ODT) disintegrating tablet 4 mg (4 mg Oral Given 06/12/17 0529)  morphine 2 MG/ML injection 4 mg (2 mg Intravenous Given 06/12/17 0530)  iopamidol (ISOVUE-300) 61 % injection 100 mL (100 mLs Intravenous Contrast Given 06/12/17 0644)  Initial Impression / Assessment and Plan / ED Course  I have reviewed the triage vital signs and the nursing notes.  Pertinent labs & imaging results that were available during my care of the patient were reviewed by me and considered in my medical decision making (see chart for details).     Patient presents emergency department for abdominal pain and back pain. Evidence of UTI on urinalysis. She has been given Rocephin and urine culture has been sent. Labs are otherwise reassuring. Due to patient age and significant tenderness on exam, CT of the abdomen and pelvis has been ordered. Patient received 2 mg of morphine as she was concerned 4 mg would be too much and is now resting comfortably.  At shift change care was transferred to [facility], PA-C who will follow pending studies, re-evaulate and determine  disposition.    Final Clinical Impressions(s) / ED Diagnoses   Final diagnoses:  Urinary tract infection with hematuria, site unspecified  Lower abdominal pain    New Prescriptions New Prescriptions   No medications on file     Agapito Games 06/12/17 0700    Merryl Hacker, MD 06/17/17 2340

## 2017-06-12 NOTE — ED Triage Notes (Signed)
Patient complaining of Lower abdominal pain that has been going on for about three weeks. Patient has had a bowel movement today. Patient states she has a lot of nausea and gas. Patient states the pain has been moving around to different spots in her abdomen.

## 2017-06-12 NOTE — Anesthesia Procedure Notes (Signed)
Procedure Name: Intubation Date/Time: 06/12/2017 11:39 AM Performed by: Maxwell Caul Pre-anesthesia Checklist: Patient identified, Emergency Drugs available, Suction available and Patient being monitored Patient Re-evaluated:Patient Re-evaluated prior to induction Oxygen Delivery Method: Circle system utilized Preoxygenation: Pre-oxygenation with 100% oxygen Induction Type: IV induction Laryngoscope Size: Mac and 4 Grade View: Grade I Tube type: Oral Tube size: 7.5 mm Number of attempts: 1 Airway Equipment and Method: Stylet Placement Confirmation: ETT inserted through vocal cords under direct vision,  positive ETCO2 and breath sounds checked- equal and bilateral Secured at: 22 cm Tube secured with: Tape Dental Injury: Teeth and Oropharynx as per pre-operative assessment  Comments: DL X1 by EMT student with small nick noted to lower left lip. DL X 1 via CRNA with Grade 1 view and ETT passed with ease.

## 2017-06-12 NOTE — ED Provider Notes (Signed)
9:59 AM Patient with lower abdominal pain for 3 weeks, worsening and more focal to suprapubic area and RLQ last night. Handoff from [place] PA-C at shift change pending CT. Labs reassuring but with signs of UTI.   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 Also age indeterminate T11 compression fracture.   Discussed with Dr. Dina Rich who has seen patient.   Spoke with gen surg and requested consult. Plan is for patient to go to the OR this morning for appendectomy. Unclear if this will improve pain or not. R/o early appy and to classify potential appendiceal mass.   Pt rechecked and is comfortable, NPO, awaiting surgery in about an hr.    Carlisle Cater, PA-C 06/12/17 1005    Horton, Barbette Hair, MD 06/17/17 2340

## 2017-06-12 NOTE — Anesthesia Postprocedure Evaluation (Signed)
Anesthesia Post Note  Patient: Dana Duffy  Procedure(s) Performed: Procedure(s) (LRB): APPENDECTOMY LAPAROSCOPIC (N/A)     Patient location during evaluation: PACU Anesthesia Type: General Level of consciousness: awake and alert Pain management: pain level controlled Vital Signs Assessment: post-procedure vital signs reviewed and stable Respiratory status: spontaneous breathing, nonlabored ventilation, respiratory function stable and patient connected to nasal cannula oxygen Cardiovascular status: blood pressure returned to baseline and stable Postop Assessment: no signs of nausea or vomiting Anesthetic complications: no    Last Vitals:  Vitals:   06/12/17 1300 06/12/17 1315  BP: (!) 148/68 (!) 136/59  Pulse: 76 68  Resp: 17 14  Temp: 36.5 C   SpO2: 100% 100%    Last Pain:  Vitals:   06/12/17 1315  TempSrc:   PainSc: 2                  Tiajuana Amass

## 2017-06-12 NOTE — Op Note (Signed)
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  SURGEON:  Fenton Malling. Lucia Gaskins, MD  ASSISTANT:  No first assistant.  ANESTHESIA:  General endotracheal.  Anesthesiologist: Suzette Battiest, MD CRNA: Maxwell Caul, CRNA  ASA:  2E  ESTIMATED BLOOD LOSS:  Minimal.  DRAINS: none   SPECIMEN:   Appendix  COUNTS CORRECT:  YES  INDICATIONS FOR PROCEDURE: Dana Duffy is a 71 y.o. (DOB: [birth year]) white female whose primary care doctor is Jani Gravel, MD and comes to the OR for an appendectomy.   She presented with back pain and abdominal pain.  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  I discussed with her about proceeding with an appendectomy today.  I discussed with the patient, the indications and potential complications of appendiceal surgery.  The potential complications include, but are not limited to, bleeding, open surgery, bowel resection, and the possibility of another diagnosis.  OPERATIVE NOTE:  The patient underwent a general endotracheal anesthetic as supervised by Anesthesiologist: Suzette Battiest, MD CRNA: Maxwell Caul, CRNA, General, in [place] room #1.  The patient was given 2 g of cefoxitin at the beginning of the procedure and the abdomen was prepped with ChloraPrep.   A time-out was held and surgical checklist run.  An infraumbilical incision was made with sharp dissection carried down to the abdominal cavity.  An 12 mm Hasson trocar was inserted through the infraumbilical incision and into the peritoneal cavity.  A 30 degree 5 mm laparoscope was inserted through a 12 mm Hasson trocar and the Hasson trocar secured with a 0 Vicryl suture.  I placed a 5 mm trocar in the  right upper quadrant and a 5 mm torcar in left lower quadrant and did abdominal exploration.    The right and left lobes of liver unremarkable.  Stomach was unremarkable.  The pelvic organs were unremarkable.  I saw both ovaries and they looked normal.  I saw no other intra-abdominal abnormality.  The patient appeared to have an early appendicitis with the appendix located at the right pelvic brim.  As part of the appendix appeared to be a 1.5 x 2.5 cm tumor.  The appendix was not ruptured.  The mesentery of the appendix was divided with a Harmonic scalpel.  I got to the base of the appendix.  I then used a blue load 45 mm Ethicon Endo-GIA stapler and fired this across the base of the appendix.  I placed the appendix in EndoCatch bag and delivered the bag through the umbilical incision.  There was one area of bleeding at the appendiceal stump site.  I over sewed this with a 2-0 vicryl.  I irrigated the abdomen with 700 cc of saline.  After irrigating the abdomen, I then removed the trocars, in turn.  The umbilical port fascia was closed with 0 Vicryl suture.   I closed the skin each site with a 4-0 Monocryl suture and painted the wounds with DermaBond.  I then injected a total of 30 mL  of 0.25% Marcaine at the incisions.  Sponge and needle count were correct at the end of the case.  The patient was transferred to the recovery room in good condition.  The patient tolerated the procedure well and it depends on the patient's post op clinical course as to when the patient could be discharged.   Alphonsa Overall, MD, [facility] Surgery Pager: [phone] Office phone:  [phone]

## 2017-06-12 NOTE — H&P (Signed)
Re:   Dana Duffy DOB:   [birth year] MRN:   [id number]  Chief Complaint Abdominal pain  ASSESEMENT AND PLAN: 1.  Appendiceal mass  I discussed with the patient the indications and risks of appendiceal surgery.  The primary risks of appendiceal surgery include, but are not limited to, bleeding, infection, bowel surgery, and open surgery.  There is also the risk that the patient may have continued symptoms after surgery.  We discussed the typical post-operative recovery course. I tried to answer the patient's questions.  I did discuss that this may not be the source of her abdominal pain.  2.  Back pain  Probably secondary to T11 end plate fracture  I discussed following up on this. 3. HTN 4.  Hypercholesterolemia 5.  Thyroid replacement 6.  History of colonic polyps  Sees Dr. Benny Lennert 7.  Pelvic floor laxity  Chief Complaint  Patient presents with  . Abdominal Pain   PHYSICIAN REQUESTING CONSULTATION:  Abigail Butts, PA, Karenann Cai  HISTORY OF PRESENT ILLNESS: Dana Duffy is a 71 y.o. (DOB: 07-15-1946) white female whose primary care physician is Jani Gravel, MD. She is by herself in the [facility].    The patient started with back pain about 3 or 4 weeks ago. She went to [facility] where she saw a physician assistant who thought she may have a urinary tract infection. She was placed on antibiotics for this. She went to Urgent Care twice for her back pain.  I do not have records for those visits.  She went to [place] to visit her son, but continued to have symptoms. She's also developed lower abdominal pain with some nausea around the time the back pain began. As the back pain has gotten better, her abdominal pain/nausea has gotten worse.  She gives no history of fall or injury to her back.  She has seen Dr. Kathlene November for rheumatology and problems with her right knee.  She had a lap cholecystectomy by Dr. Donne Hazel of 13th of February 2017. She's had no history of stomach, liver, or  pancreas disease. She undergoes routine colonoscopy by Dr. Benson Norway. Her last colonoscopy about 2 years ago.  CT scan of abdomen/pelvis - 1. The appendix appears abnormally dilated with low-density material, but is NOT inflamed. 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   2. Moderate T11 superior endplate compression fracture is age indeterminate. If specific therapy such as vertebroplasty is desired, Thoracic MRI or Nuclear Medicine Whole-body Bone Scan would best determine acuity.  3. Diverticulosis, especially the sigmoid colon. No active inflammation.  4. Pelvic floor laxity and vesicocele.    Past Medical History:  Diagnosis Date  . Allergy   . Anemia   . Arthritis   . Cholelithiases   . Hypothyroidism   . Thyroid disease       Past Surgical History:  Procedure Laterality Date  . BUNIONECTOMY    . CHOLECYSTECTOMY N/A 12/06/2015   Procedure: LAPAROSCOPIC CHOLECYSTECTOMY;  Surgeon: Rolm Bookbinder, MD;  Location: [facility];  Service: General;  Laterality: N/A;      No current facility-administered medications for this encounter.    Current Outpatient Prescriptions  Medication Sig Dispense Refill  . acidophilus (RISAQUAD) CAPS Take 1 capsule by mouth daily.     . cholecalciferol (VITAMIN D) 1000 units tablet Take 1,000 Units by mouth daily.    Marland Kitchen levothyroxine (SYNTHROID, LEVOTHROID) 100 MCG tablet TAKE 1 TABLET ONCE DAILY. 90 tablet 0  . losartan-hydrochlorothiazide (HYZAAR) 50-12.5 MG  tablet Take 0.5 tablets by mouth daily.   5  . simvastatin (ZOCOR) 20 MG tablet TAKE ONE TABLET AT BEDTIME. 90 tablet 0  . meloxicam (MOBIC) 7.5 MG tablet Take 1 tablet (7.5 mg total) by mouth daily. (Patient not taking: Reported on 06/12/2017) 30 tablet 0  . promethazine-codeine (PHENERGAN WITH CODEINE) 6.25-10 MG/5ML syrup Take 5 mLs by mouth every 6 (six) hours as needed for cough. (Patient not taking: Reported on 05/25/2017) 120 mL 0      Allergies  Allergen  Reactions  . Gluten Meal Other (See Comments)    Avoids gluten   . Sulfa Antibiotics Other (See Comments)    SKIN REDNESS    REVIEW OF SYSTEMS: Skin:  No history of rash.  No history of abnormal moles. Infection:  No history of hepatitis or HIV.  No history of MRSA. Neurologic:  No history of stroke.  No history of seizure.  No history of headaches. Cardiac:  HTN x 10 years +.  No history of prior cardiac catheterization.  No history of seeing a cardiologist. Pulmonary:  Does not smoke cigarettes.  No asthma or bronchitis.  No OSA/CPAP.  Endocrine:  History of I131 for hyperthyroid disease 20 + years ago.  On thyroid replacement. Gastrointestinal:  See HPI Urologic:  No history of kidney stones.  No history of bladder infections. Musculoskeletal:  Has seen Dr. Kathlene November for rheumatology.  Has chronic back issues/DJD, but usually taken care of with Yoga. Hematologic:  No bleeding disorder.  No history of anemia.  Not anticoagulated. Psycho-social:  The patient is oriented.   The patient has no obvious psychologic or social impairment to understanding our conversation and plan.  SOCIAL and FAMILY HISTORY: Divorced. Directs a smalls arts studio, [employer] Has 2 sons - [place] in [place]  No family history of appendiceal tumors.  PHYSICAL EXAM: BP (!) 144/60 (BP Location: Right Arm)   Pulse 64   Temp 98.4 F (36.9 C) (Oral)   Resp 16   Ht 5\' 6"  (1.676 m)   Wt 75.8 kg (167 lb)   SpO2 97%   BMI 26.95 kg/m   General: WN older WF who is alert and generally healthy appearing.  Skin:  Inspection and palpation - no mass or rash. Eyes:  Conjunctiva and lids unremarkable.            Pupils are equal Ears, Nose, Mouth, and Throat:  Ears and nose unremarkable            Lips and teeth are unremarable. Neck: Supple. No mass, trachea midline.  No thyroid mass. Lymph Nodes:  No supraclavicular, cervical, or inguinal nodes. Lungs: Normal respiratory  effort.  Clear to auscultation and symmetric breath sounds. Heart:  Palpation of the heart is normal.            Auscultation: RRR. No murmur or rub.  Abdomen: Soft. No mass. No hernia.             Normal bowel sounds.  Has GB scars.    Not much evidence of tenderness on my exam today Rectal: Not done. Musculoskeletal:  Good muscle strength and ROM  in upper and lower extremities. Neurologic:  Grossly intact to motor and sensory function. Psychiatric: Normal judgement and insight. Behavior is normal.            Oriented to time, person, place.   DATA REVIEWED, COUNSELING AND COORDINATION OF CARE: Epic notes reviewed. Counseling and coordination  of care exceeded more than 50% of the time spent with patient. Total time spent with patient and charting: 45 minutes  Alphonsa Overall, MD,  [facility], [place] [address]  [place], [place]    [place] Phone:  [phone] FAX:  [phone]

## 2017-06-12 NOTE — Anesthesia Preprocedure Evaluation (Signed)
Anesthesia Evaluation  Patient identified by MRN, date of birth, ID band Patient awake    Reviewed: Allergy & Precautions, NPO status , Patient's Chart, lab work & pertinent test results  Airway Mallampati: II  TM Distance: >3 FB Neck ROM: Full    Dental no notable dental hx.    Pulmonary neg pulmonary ROS,    Pulmonary exam normal breath sounds clear to auscultation       Cardiovascular negative cardio ROS Normal cardiovascular exam Rhythm:Regular Rate:Normal     Neuro/Psych negative neurological ROS  negative psych ROS   GI/Hepatic negative GI ROS, Neg liver ROS,   Endo/Other  Hypothyroidism   Renal/GU negative Renal ROS  negative genitourinary   Musculoskeletal  (+) Arthritis ,   Abdominal   Peds negative pediatric ROS (+)  Hematology negative hematology ROS (+)   Anesthesia Other Findings   Reproductive/Obstetrics negative OB ROS                             Lab Results  Component Value Date   WBC 9.2 06/12/2017   HGB 13.8 06/12/2017   HCT 40.7 06/12/2017   MCV 93.8 06/12/2017   PLT 163 06/12/2017   Lab Results  Component Value Date   CREATININE 0.66 06/12/2017   BUN 12 06/12/2017   NA 138 06/12/2017   K 3.6 06/12/2017   CL 104 06/12/2017   CO2 24 06/12/2017    Anesthesia Physical  Anesthesia Plan  ASA: II  Anesthesia Plan: General   Post-op Pain Management:    Induction: Intravenous  PONV Risk Score and Plan:   Airway Management Planned: Oral ETT  Additional Equipment:   Intra-op Plan:   Post-operative Plan: Extubation in OR  Informed Consent: I have reviewed the patients History and Physical, chart, labs and discussed the procedure including the risks, benefits and alternatives for the proposed anesthesia with the patient or authorized representative who has indicated his/her understanding and acceptance.   Dental advisory given  Plan Discussed  with: CRNA  Anesthesia Plan Comments:         Anesthesia Quick Evaluation

## 2017-06-12 NOTE — ED Notes (Signed)
Patient transported to CT 

## 2017-06-12 NOTE — Transfer of Care (Signed)
Immediate Anesthesia Transfer of Care Note  Patient: Dana Duffy  Procedure(s) Performed: Procedure(s): APPENDECTOMY LAPAROSCOPIC (N/A)  Patient Location: PACU  Anesthesia Type:General  Level of Consciousness:  sedated, patient cooperative and responds to stimulation  Airway & Oxygen Therapy:Patient Spontanous Breathing and Patient connected to face mask oxgen  Post-op Assessment:  Report given to PACU RN and Post -op Vital signs reviewed and stable  Post vital signs:  Reviewed and stable  Last Vitals:  Vitals:   06/12/17 0749 06/12/17 0915  BP: (!) 144/60   Pulse: 64 78  Resp: 16   Temp:    SpO2: 07% 37%    Complications: No apparent anesthesia complications

## 2017-06-12 NOTE — ED Notes (Signed)
PA at bedside.

## 2017-06-13 LAB — URINE CULTURE

## 2017-06-13 MED ORDER — HYDROCODONE-ACETAMINOPHEN 5-325 MG PO TABS: 1.0000 | ORAL_TABLET | ORAL | 0 refills | Status: DC | PRN

## 2017-06-13 NOTE — Discharge Instructions (Signed)
Please arrive at least 30 min before your appointment to complete your check in paperwork.  If you are unable to arrive 30 min prior to your appointment time we may have to cancel or reschedule you.  LAPAROSCOPIC SURGERY: POST OP INSTRUCTIONS  1. DIET: Follow a light bland diet the first 24 hours after arrival home, such as soup, liquids, crackers, etc. Be sure to include lots of fluids daily. Avoid fast food or heavy meals as your are more likely to get nauseated. Eat a low fat the next few days after surgery.  2. Take your usually prescribed home medications unless otherwise directed. 3. PAIN CONTROL:  1. Pain is best controlled by a usual combination of three different methods TOGETHER:  1. Ice/Heat 2. Over the counter pain medication 3. Prescription pain medication 2. Most patients will experience some swelling and bruising around the incisions. Ice packs or heating pads (30-60 minutes up to 6 times a day) will help. Use ice for the first few days to help decrease swelling and bruising, then switch to heat to help relax tight/sore spots and speed recovery. Some people prefer to use ice alone, heat alone, alternating between ice & heat. Experiment to what works for you. Swelling and bruising can take several weeks to resolve.  3. It is helpful to take an over-the-counter pain medication regularly for the first few weeks. Choose one of the following that works best for you:  1. Naproxen (Aleve, etc) Two 220mg  tabs twice a day 2. Ibuprofen (Advil, etc) Three 200mg  tabs four times a day (every meal & bedtime) 3. Acetaminophen (Tylenol, etc) 500-650mg  four times a day (every meal & bedtime) 4. A prescription for pain medication (such as oxycodone, hydrocodone, etc) should be given to you upon discharge. Take your pain medication as prescribed.  1. If you are having problems/concerns with the prescription medicine (does not control pain, nausea, vomiting, rash, itching, etc), please call us [phone]  [phone] to see if we need to switch you to a different pain medicine that will work better for you and/or control your side effect better. 2. If you need a refill on your pain medication, please contact your pharmacy. They will contact our office to request authorization. Prescriptions will not be filled after 5 pm or on week-ends. 4. Avoid getting constipated. Between the surgery and the pain medications, it is common to experience some constipation. Increasing fluid intake and taking a fiber supplement (such as Metamucil, Citrucel, FiberCon, MiraLax, etc) 1-2 times a day regularly will usually help prevent this problem from occurring. A mild laxative (prune juice, Milk of Magnesia, MiraLax, etc) should be taken according to package directions if there are no bowel movements after 48 hours.  5. Watch out for diarrhea. If you have many loose bowel movements, simplify your diet to bland foods & liquids for a few days. Stop any stool softeners and decrease your fiber supplement. Switching to mild anti-diarrheal medications (Kayopectate, Pepto Bismol) can help. If this worsens or does not improve, please call us. 6. Wash / shower every day. You may shower over the incisions as the glue is waterproof.  7. ACTIVITIES as tolerated:  1. You may resume regular (light) daily activities beginning the next day--such as daily self-care, walking, climbing stairs--gradually increasing activities as tolerated. If you can walk 30 minutes without difficulty, it is safe to try more intense activity such as jogging, treadmill, bicycling, low-impact aerobics, swimming, etc. 2. Save the most intensive and strenuous activity for last such  as sit-ups, heavy lifting, contact sports, etc Refrain from any heavy lifting or straining until you are off narcotics for pain control. NO HEAVY LIFTING FOR 4 WEEKS (nothing >20lbs) 3. DO NOT PUSH THROUGH PAIN. Let pain be your guide: If it hurts to do something, don't do it. Pain is your body  warning you to avoid that activity for another week until the pain goes down. 4. You may drive when you are no longer taking prescription pain medication, you can comfortably wear a seatbelt, and you can safely maneuver your car and apply brakes. 5. You may have sexual intercourse when it is comfortable.  8. FOLLOW UP in our office  1. Please call [facility] at [phone] to set up an appointment to see your surgeon in the office for a follow-up appointment approximately 2-3 weeks after your surgery. 2. Make sure that you call for this appointment the day you arrive home to insure a convenient appointment time.      10. IF YOU HAVE DISABILITY OR FAMILY LEAVE FORMS, BRING THEM TO THE               OFFICE FOR PROCESSING.   WHEN TO CALL us [phone]:  1. Poor pain control 2. Reactions / problems with new medications (rash/itching, nausea, etc)  3. Fever over 101.5 F (38.5 C) 4. Inability to urinate 5. Nausea and/or vomiting 6. Worsening swelling or bruising 7. Continued bleeding from incision. 8. Increased pain, redness, or drainage from the incision  The clinic staff is available to answer your questions during regular business hours (8:30am-5pm). Please dont hesitate to call and ask to speak to one of our nurses for clinical concerns.  If you have a medical emergency, go to the nearest emergency room or call 911.  A surgeon from [facility] is always on call at the [facility], [place], [place], [place] at [place], [place] [address] ?  MAIN: [phone] ? TOLL FREE: [phone] ?  FAX [phone]  [email or web address]

## 2017-06-13 NOTE — Progress Notes (Signed)
Pt discharged home with all belongings and paper rx.

## 2017-06-13 NOTE — Progress Notes (Signed)
Pt tolerated breakfast well. Discharge education complete. No c/o pain, N/V, discomfort. All pt belongings sent with pt.

## 2017-06-13 NOTE — Discharge Summary (Signed)
[facility]/Trauma Discharge Summary   Patient ID: Dana Duffy MRN: [id number] DOB/AGE: [birth year] 71 y.o.  Admit date: 06/12/2017 Discharge date: 06/13/2017  Admitting Diagnosis: RLQ abdominal pain Mucocele of appendix  Final pathology is pending  Discharge Diagnosis Patient Active Problem List   Diagnosis Date Noted  . 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 Hammer toe of left foot 06/18/2014  . BMI 27.0-27.9,adult 06/12/2012  . Hypothyroidism 06/12/2012  . Hyperlipidemia 06/12/2012    Consultants none  Imaging: Ct Abdomen Pelvis W Contrast  Result Date: 06/12/2017 CLINICAL DATA:  71 year old female with 3 weeks of lower abdominal pain. Nausea. Prior cholecystectomy. EXAM: CT ABDOMEN AND PELVIS WITH CONTRAST TECHNIQUE: Multidetector CT imaging of the abdomen and pelvis was performed using the standard protocol following bolus administration of intravenous contrast. CONTRAST:  100 mL Isovue-300 COMPARISON:  Abdomen radiographs 05/25/2017. Ultrasound 10/25/2015. Lumbar MRI 11/12/2014. FINDINGS: Lower chest: Mild cardiomegaly. No pericardial effusion. Dependent lower lobe pulmonary opacity most resembling atelectasis. No pleural effusion. Hepatobiliary: Surgically absent gallbladder. Associated mild prominence of the intra- and extrahepatic biliary ducts. There are several small low-density areas in the liver, the largest measuring 15 mm at the right dome has simple fluid density. There is a smaller lesion in the left hepatic lobe on series 2, image 15. On the 2017 ultrasound these areas appeared to be small benign cysts. Pancreas: Negative. Spleen: Negative. Adrenals/Urinary Tract: Normal adrenal glands. Bilateral renal enhancement and contrast excretion is within normal limits. Pelvic floor laxity allowing for descent of the urinary bladder (vesicocele). Otherwise unremarkable bladder. Stomach/Bowel: Negative rectum. Moderate diverticulosis in the sigmoid colon. No definite active  inflammation. Mild diverticulosis in the left colon with no inflammation. Similar volume of retained stool throughout all colonic segments. Occasional diverticula at the hepatic flexure. The ileocecal valve is on series 2, image 47 and coronal image 42. The appendix arises from the cecum on series 2, image 52 and coronal image 46, and courses inferiorly appearing mildly to moderately dilated with internal low density material 11-12 mm diameter. The wall of the appendix also appears mildly thickened. However, there is no surrounding inflammation. There are some adjacent fluid-filled small bowel loops in the pelvis which otherwise appear normal. Much of the remaining small bowel is decompressed. Small gastric hiatal hernia. Otherwise negative stomach. Duodenum is within normal limits. No abdominal free fluid or free air. Vascular/Lymphatic: Mild Calcified aortic atherosclerosis. Aortic and iliac artery ectasia. Major arterial structures in the abdomen and pelvis are patent. Portal venous system is patent. No lymphadenopathy. Reproductive: Within normal limits. Other: Pelvic floor laxity.  No pelvic free fluid. Musculoskeletal: Osteopenia. Levoconvex thoracolumbar scoliosis with apex at L1-L2. Degenerative vertebral body sclerosis and endplate spurring. Superior endplate compression fracture of T11 is new since 2016 but age indeterminate (sagittal image 92). Chronic T12 and L1 superior endplate Schmorl nodes. No other acute osseous abnormality identified. IMPRESSION: 1. The appendix appears abnormally dilated with low-density material, but is NOT inflamed. 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 Recommend Gastroenterology and/or GI Surgery follow-up for further evaluation. 2. Moderate T11 superior endplate compression fracture is age indeterminate. If specific therapy such as vertebroplasty is desired, Thoracic MRI or Nuclear Medicine Whole-body Bone Scan would best determine acuity. 3.  Diverticulosis, especially the sigmoid colon. No active inflammation. 4. Pelvic floor laxity and vesicocele. Electronically Signed   By: Genevie Ann M.D.   On: 06/12/2017 07:31    Procedures Dr. Lucia Gaskins (06/12/17) - Laparoscopic Appendectomy  Hospital Course:  Dana Duffy is a  very pleasant 71 year old female who presented to ED with RLQ and back pain.  Workup showed dilated appendix suspicious for mucocele.  Patient was admitted and underwent procedure listed above.  Tolerated procedure well and was transferred to the floor.   Diet was advanced as tolerated.  On POD#1, the patient was voiding well, tolerating diet, ambulating well, pain well controlled, vital signs stable, incisions c/d/i and felt stable for discharge home.  Patient will follow up in our office in 2 weeks and knows to call with questions or concerns.  She will call to confirm appointment date/time.    Patient was discharged in good condition.  The [place] Substance controlled database was reviewed prior to prescribing narcotic pain medication to this patient.  Physical Exam: General:  Alert, NAD, pleasant, cooperative Cardio: RRR, S1 & S2 normal, no murmur, rubs, gallops Resp: Effort normal, lungs CTA bilaterally, no wheezes, rales, rhonchi Abd:  Soft, not distended, normal bowel sounds, no tenderness, incisions with glue intact and mild surrounding ecchymosis without signs of infection or drainage Skin: No rashes noted, warm and dry  Allergies as of 06/13/2017      Reactions   Gluten Meal Other (See Comments)   Avoids gluten    Sulfa Antibiotics Other (See Comments)   SKIN REDNESS      Medication List    TAKE these medications   acidophilus Caps capsule Take 1 capsule by mouth daily.   cholecalciferol 1000 units tablet Commonly known as:  VITAMIN D Take 1,000 Units by mouth daily.   HYDROcodone-acetaminophen 5-325 MG tablet Commonly known as:  NORCO/VICODIN Take 1 tablet by mouth every 4 (four) hours as  needed for moderate pain.   levothyroxine 100 MCG tablet Commonly known as:  SYNTHROID, LEVOTHROID TAKE 1 TABLET ONCE DAILY.   losartan-hydrochlorothiazide 50-12.5 MG tablet Commonly known as:  HYZAAR Take 0.5 tablets by mouth daily.   meloxicam 7.5 MG tablet Commonly known as:  MOBIC Take 1 tablet (7.5 mg total) by mouth daily.   promethazine-codeine 6.25-10 MG/5ML syrup Commonly known as:  PHENERGAN with CODEINE Take 5 mLs by mouth every 6 (six) hours as needed for cough.   simvastatin 20 MG tablet Commonly known as:  ZOCOR TAKE ONE TABLET AT BEDTIME.            Discharge Care Instructions        Start     Ordered   06/13/17 0000  HYDROcodone-acetaminophen (NORCO/VICODIN) 5-325 MG tablet  Every 4 hours PRN    Question:  Supervising Provider  Answer:  Alphonsa Overall   06/13/17 [id number]       Follow-up Information    Alphonsa Overall, MD. Call.   Specialty:  General Surgery Why:  to confirm your appointment date and time Contact information: [place] [address] [phone]           Signed: [facility] 06/13/2017, 9:06 AM Pager: [phone] Consults: [phone] Mon-Fri 7:00 am-4:30 pm Sat-Sun 7:00 am-11:30 am  Agree with above. She can call the office for the final path report.  Alphonsa Overall, MD, [facility] Surgery Pager: [phone] Office phone:  [phone]

## 2017-10-09 ENCOUNTER — Ambulatory Visit (INDEPENDENT_AMBULATORY_CARE_PROVIDER_SITE_OTHER): Payer: Medicare Other | Admitting: Orthopaedic Surgery

## 2017-10-09 ENCOUNTER — Encounter (INDEPENDENT_AMBULATORY_CARE_PROVIDER_SITE_OTHER): Payer: Self-pay | Admitting: Orthopaedic Surgery

## 2017-10-09 ENCOUNTER — Ambulatory Visit (INDEPENDENT_AMBULATORY_CARE_PROVIDER_SITE_OTHER): Payer: Medicare Other

## 2017-10-09 VITALS — BP 154/82 | HR 91 | Ht 66.0 in | Wt 167.0 lb

## 2017-10-09 DIAGNOSIS — M546 Pain in thoracic spine: Secondary | ICD-10-CM

## 2017-10-09 DIAGNOSIS — M4125 Other idiopathic scoliosis, thoracolumbar region: Secondary | ICD-10-CM

## 2017-10-09 NOTE — Progress Notes (Signed)
Office Visit Note   Patient: Dana Duffy           Date of Birth: [birth year]           MRN: [id number] Visit Date: 10/09/2017              Requested by: Jani Gravel, [place], [place] [address] PCP: Jani Gravel, MD   Assessment & Plan: Visit Diagnoses:  1. Pain in thoracic spine   2. Other idiopathic scoliosis, thoracolumbar region     Plan: Patient has scoliosis which she did not know about until the last 10 months.  She is having intermittent symptoms in the left leg likely from some lateral recess stenosis related to her facet arthropathy and scoliosis.  Her symptoms are not consistent and she is doing yoga stretch exercises and occasionally uses a cane and ibuprofen when it bothers her.  If she develops increased symptoms she can call and let us know we can consider lumbar diagnostic MRI imaging.  Currently her symptoms are not significant enough to consider further intervention.  She will continue walking program, calcium and vitamin D for osteopenia.  Return if radicular symptoms increase.  Previous CT scan previous chest x-rays for the last 4 years were reviewed with her showing stable changes at T11.  Follow-Up Instructions: No Follow-up on file.   Orders:  Orders Placed This Encounter  Procedures  . XR Thoracic Spine 2 View   No orders of the defined types were placed in this encounter.     Procedures: No procedures performed   Clinical Data: No additional findings.   Subjective: Chief Complaint  Patient presents with  . Middle Back - Pain    HPI 71 year old female had her appendix removed this summer.  CT scan demonstrated some Schmorl's nodes in L1 also T12 and it appeared that the superior midportion endplate at T02 had compressed although this looks more like a very large Schmorl's node.  She has thoracolumbar curvature with endplate sclerosis and spurring and has had some pain in her back that tends to run down her left leg  sometimes requires her to use a cane for period of time and the pain tends to  run to the lateral foot.  She does some yoga stretches which tends to help.  She is a Hydrographic surveyor.  She did well with her pending surgery.  Review of Systems scoliosis, hyperlipidemia, recent appendix removal this summer.  Thoracolumbar scoliosis.  Intermittent sciatica on the left side.  History of osteopenia acid reflux hypertension.  Cholecystectomy 2017.   Objective: Vital Signs: BP (!) 154/82   Pulse 91   Ht 5\' 6"  (1.676 m)   Wt 167 lb (75.8 kg)   BMI 26.95 kg/m   Physical Exam  Constitutional: She is oriented to person, place, and time. She appears well-developed.  HENT:  Head: Normocephalic.  Right Ear: External ear normal.  Left Ear: External ear normal.  Eyes: Pupils are equal, round, and reactive to light.  Neck: No tracheal deviation present. No thyromegaly present.  Cardiovascular: Normal rate.  Pulmonary/Chest: Effort normal.  Abdominal: Soft.  Neurological: She is alert and oriented to person, place, and time.  Skin: Skin is warm and dry.  Psychiatric: She has a normal mood and affect. Her behavior is normal.    Ortho Exam patient is able ambulate negative straight leg raising.  No pain with hip range of motion.  No pitting edema.  Mild pelvic obliquity.  Mild  scoliosis noted with thoracolumbar curvature.  Mild sciatic notch tenderness on the left. Specialty Comments:  No specialty comments available.  Imaging: No results found.   PMFS History: Patient Active Problem List   Diagnosis Date Noted  . 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 Hammer toe of left foot 06/18/2014  . BMI 27.0-27.9,adult 06/12/2012  . Hypothyroidism 06/12/2012  . Hyperlipidemia 06/12/2012   Past Medical History:  Diagnosis Date  . Allergy   . Anemia   . Arthritis   . Cholelithiases   . Hypothyroidism   . Thyroid disease     Family History  Problem Relation Age of Onset  . Heart disease Mother     . Hyperlipidemia Mother   . Hypertension Mother   . Asthma Son   . Depression Maternal Grandmother   . Hyperlipidemia Maternal Grandmother   . Diabetes Father   . Cancer Sister     Past Surgical History:  Procedure Laterality Date  . BUNIONECTOMY    . CHOLECYSTECTOMY N/A 12/06/2015   Procedure: LAPAROSCOPIC CHOLECYSTECTOMY;  Surgeon: Rolm Bookbinder, MD;  Location: [facility];  Service: General;  Laterality: N/A;  . LAPAROSCOPIC APPENDECTOMY N/A 06/12/2017   Procedure: APPENDECTOMY LAPAROSCOPIC;  Surgeon: Alphonsa Overall, MD;  Location: [facility];  Service: General;  Laterality: N/A;   Social History   Occupational History  . Occupation: Artist  Tobacco Use  . Smoking status: Never Smoker  . Smokeless tobacco: Never Used  Substance and Sexual Activity  . Alcohol use: Yes    Alcohol/week: 1.2 oz    Types: 2 Glasses of wine per week    Comment: occas  . Drug use: No  . Sexual activity: Not on file

## 2017-10-22 ENCOUNTER — Telehealth (INDEPENDENT_AMBULATORY_CARE_PROVIDER_SITE_OTHER): Payer: Self-pay | Admitting: Radiology

## 2017-11-19 NOTE — Telephone Encounter (Signed)
Made in error

## 2018-02-06 ENCOUNTER — Other Ambulatory Visit: Payer: Self-pay

## 2018-02-06 ENCOUNTER — Encounter: Payer: Self-pay | Admitting: Physician Assistant

## 2018-02-06 ENCOUNTER — Ambulatory Visit: Payer: Medicare Other | Admitting: Physician Assistant

## 2018-02-06 VITALS — BP 130/86 | HR 73 | Temp 97.7°F | Resp 16 | Ht 65.0 in | Wt 170.4 lb

## 2018-02-06 DIAGNOSIS — N8110 Cystocele, unspecified: Secondary | ICD-10-CM | POA: Diagnosis not present

## 2018-02-06 DIAGNOSIS — R300 Dysuria: Secondary | ICD-10-CM | POA: Diagnosis not present

## 2018-02-06 DIAGNOSIS — N898 Other specified noninflammatory disorders of vagina: Secondary | ICD-10-CM

## 2018-02-06 DIAGNOSIS — N952 Postmenopausal atrophic vaginitis: Secondary | ICD-10-CM | POA: Diagnosis not present

## 2018-02-06 LAB — POCT WET + KOH PREP
Trich by wet prep: ABSENT
Yeast by KOH: ABSENT
Yeast by wet prep: ABSENT

## 2018-02-06 LAB — POCT URINALYSIS DIP (MANUAL ENTRY)
Bilirubin, UA: NEGATIVE
Glucose, UA: NEGATIVE mg/dL
Ketones, POC UA: NEGATIVE mg/dL
Nitrite, UA: NEGATIVE
Protein Ur, POC: NEGATIVE mg/dL
Spec Grav, UA: 1.015 (ref 1.010–1.025)
Urobilinogen, UA: 0.2 U/dL
pH, UA: 5.5 (ref 5.0–8.0)

## 2018-02-06 LAB — POC MICROSCOPIC URINALYSIS (UMFC): Mucus: ABSENT

## 2018-02-06 NOTE — Progress Notes (Signed)
02/06/2018 at 8:24 AM  Dana Duffy / DOB: October 28, 1945 / MRN: [id number]  The patient has BMI 27.0-27.9,adult; Hypothyroidism; Hyperlipidemia; Hammer toe of left foot; and Appendiceal tumor on their problem list.  SUBJECTIVE  Dana Duffy is a 72 y.o. female PMH hypothyroidism and HLD who complains of dysuria and urinary frequency x 2 days. She also endorses vaginal irritation and fullness, "I wear pants because they hold every thing together".  She denies hematuria, urinary frequency, urinary urgency, flank pain, abdominal pain and cloudy malordorous urine. Has tried cranberry juice and a heating pad on her back with no relief. Her symptoms are a little better today "now it feels like a ghost of a burning". Most recent UTI prior to this was 06/12/2017.   She  has a past medical history of Allergy, Anemia, Arthritis, Cholelithiases, Hypothyroidism, and Thyroid disease.    Medications reviewed and updated by myself where necessary, and exist elsewhere in the encounter.   [name] is allergic to gluten meal and sulfa antibiotics. She  reports that she has never smoked. She has never used smokeless tobacco. She reports that she drinks about 1.2 oz of alcohol per week. She reports that she does not use drugs. She  has no sexual activity history on file. The patient  has a past surgical history that includes Bunionectomy; Cholecystectomy (N/A, 12/06/2015); and laparoscopic appendectomy (N/A, 06/12/2017).  Her family history includes Asthma in her son; Cancer in her sister; Depression in her maternal grandmother; Diabetes in her father; Heart disease in her mother; Hyperlipidemia in her maternal grandmother and mother; Hypertension in her mother.  Review of Systems  Constitutional: Negative for chills, diaphoresis and fever.  Cardiovascular: Negative for chest pain and palpitations.  Genitourinary: Positive for dysuria and frequency. Negative for flank pain, hematuria and urgency.     OBJECTIVE  Her  height is 5\' 5"  (1.651 m) and weight is 170 lb 6.4 oz (77.3 kg). Her oral temperature is 97.7 F (36.5 C). Her blood pressure is 130/86 and her pulse is 73. Her respiration is 16 and oxygen saturation is 99%.  The patient's body mass index is 28.36 kg/m.  Physical Exam  Constitutional: She is oriented to person, place, and time. She appears well-developed and well-nourished.  GI: Soft. There is no tenderness. There is no CVA tenderness.  Genitourinary: Vagina normal. Pelvic exam was performed with patient supine.  Genitourinary Comments: Second degree vaginal prolapse. Vaginal atrophy.   Neurological: She is alert and oriented to person, place, and time.  Psychiatric: She has a normal mood and affect. Her behavior is normal. Judgment and thought content normal.    Results for orders placed or performed in visit on 02/06/18  POCT Microscopic Urinalysis ([facility])  Result Value Ref Range   WBC,UR,HPF,POC Few (A) None WBC/hpf   RBC,UR,HPF,POC None None RBC/hpf   Bacteria Few (A) None, Too numerous to count   Mucus Absent Absent   Epithelial Cells, UR Per Microscopy Few (A) None, Too numerous to count cells/hpf  POCT urinalysis dipstick  Result Value Ref Range   Color, UA yellow yellow   Clarity, UA clear clear   Glucose, UA negative negative mg/dL   Bilirubin, UA negative negative   Ketones, POC UA negative negative mg/dL   Spec Grav, UA 1.015 1.010 - 1.025   Blood, UA trace-lysed (A) negative   pH, UA 5.5 5.0 - 8.0   Protein Ur, POC negative negative mg/dL   Urobilinogen, UA 0.2 0.2 or 1.0 E.U./dL  Nitrite, UA Negative Negative   Leukocytes, UA Small (1+) (A) Negative  POCT Wet + KOH Prep  Result Value Ref Range   Yeast by KOH Absent Absent   Yeast by wet prep Absent Absent   WBC by wet prep None (A) Few   Clue Cells Wet Prep HPF POC None None   Trich by wet prep Absent Absent   Bacteria Wet Prep HPF POC Few Few   Epithelial Cells By [employer] Pref ([facility]) Few  None, Few, Too numerous to count   RBC,UR,HPF,POC None None RBC/hpf    ASSESSMENT & PLAN .1. Vaginal atrophy 2. Vaginal prolapse - Eddye was seen today for burning with urination and vaginal discomfort.  Her vitals are stable.  UA is equivocal for UTI - will await culture to decide treatment. Negative wet prep. Vaginal prolapse and vaginal atrophy on PE. Advised supportive care with coconut oil. She plans to schedule an appt with [facility] to discuss possible pessary and vaginal atrophy. The patient was advised to call or come back to clinic if she does not see an improvement in symptoms, or worsens with the above plan.   3. Vaginal irritation - POCT Wet + KOH Prep  4. Burning with urination - POCT Microscopic Urinalysis ([facility]) - POCT urinalysis dipstick - Urine Culture  Mercer Pod, PA-C  [facility] 02/06/2018 8:25 AM

## 2018-02-06 NOTE — Patient Instructions (Addendum)
Your urine is equivocal for UTI - I am sending a urine culture to the lab. If bacteria grow, we will call you and start an antibiotic.  No yeast on your swab today.. I suspect your discomfort is a combination of thinning of your vaginal tissue and the vaginal prolapse.  I would recommend trying coconut oil, Glycerin, cooking oils. You can discuss estrogen therapy with GYN.   To discuss vaginal pressure, please make an appointment with [facility] (you do not need a referral): [address], [place] [phone]  Atrophic Vaginitis Atrophic vaginitis is a condition in which the tissues that line the vagina become dry and thin. This condition is most common in women who have stopped having regular menstrual periods (menopause). This usually starts when a woman is 34-47 years old. Estrogen helps to keep the vagina moist. It stimulates the vagina to produce a clear fluid that lubricates the vagina for sexual intercourse. This fluid also protects the vagina from infection. Lack of estrogen can cause the lining of the vagina to get thinner and dryer. The vagina may also shrink in size. It may become less elastic. Atrophic vaginitis tends to get worse over time as a woman's estrogen level drops. What are the causes? This condition is caused by the normal drop in estrogen that happens around the time of menopause. What increases the risk? Certain conditions or situations may lower a woman's estrogen level, which increases her risk of atrophic vaginitis. These include:  Taking medicine that blocks estrogen.  Having ovaries removed surgically.  Being treated for cancer with X-ray treatment (radiation) or medicines (chemotherapy).  Exercising very hard and often.  Having an eating disorder (anorexia).  Giving birth or breastfeeding.  Being over the age of 4.  Smoking.  What are the signs or symptoms? Symptoms of this condition include:  Pain, soreness, or  bleeding during sexual intercourse (dyspareunia).  Vaginal burning, irritation, or itching.  Pain or bleeding during a vaginal examination using a speculum (pelvic exam).  Loss of interest in sexual activity.  Having burning pain when passing urine.  Vaginal discharge that is brown or yellow.  In some cases, there are no symptoms. How is this diagnosed? This condition is diagnosed with a medical history and physical exam. This will include a pelvic exam that checks whether the inside of your vagina appears pale, thin, or dry. Rarely, you may also have other tests, including:  A urine test.  A test that checks the acid balance in your vaginal fluid (acid balance test).  How is this treated? Treatment for this condition may depend on the severity of your symptoms. Treatment may include:  Using an over-the-counter vaginal lubricant before you have sexual intercourse.  Using a long-acting vaginal moisturizer.  Using low-dose vaginal estrogen for moderate to severe symptoms that do not respond to other treatments. Options include creams, tablets, and inserts (vaginal rings). Before using vaginal estrogen, tell your health care provider if you have a history of: ? Breast cancer. ? Endometrial cancer. ? Blood clots.  Taking medicines. You may be able to take a daily pill for dyspareunia. Discuss all of the risks of this medicine with your health care provider. It is usually not recommended for women who have a family history or personal history of breast cancer.  If your symptoms are very mild and you are not sexually active, you may not need treatment. Follow these instructions at home:  Take medicines only as directed by your  health care provider. Do not use herbal or alternative medicines unless your health care provider says that you can.  Use over-the-counter creams, lubricants, or moisturizers for dryness only as directed by your health care provider.  If your atrophic  vaginitis is caused by menopause, discuss all of your menopausal symptoms and treatment options with your health care provider.  Do not douche.  Do not use products that can make your vagina dry. These include: ? Scented feminine sprays. ? Scented tampons. ? Scented soaps.  If it hurts to have sex, talk with your sexual partner. Contact a health care provider if:  Your discharge looks different than normal.  Your vagina has an unusual smell.  You have new symptoms.  Your symptoms do not improve with treatment.  Your symptoms get worse. This information is not intended to replace advice given to you by your health care provider. Make sure you discuss any questions you have with your health care provider. Document Released: 02/23/2015 Document Revised: 03/16/2016 Document Reviewed: 09/30/2014 Elsevier Interactive Patient Education  [employer].  Thank you for coming in today. I hope you feel we met your needs.  Feel free to call PCP if you have any questions or further requests.  Please consider signing up for MyChart if you do not already have it, as this is a great way to communicate with me.  Best,  Whitney McVey, PA-C  IF you received an x-ray today, you will receive an invoice from [facility]. Please contact [facility] at [phone] with questions or concerns regarding your invoice.   IF you received labwork today, you will receive an invoice from [facility]. Please contact [facility] at [phone] with questions or concerns regarding your invoice.   Our billing staff will not be able to assist you with questions regarding bills from these companies.  You will be contacted with the lab results as soon as they are available. The fastest way to get your results is to activate your My Chart account. Instructions are located on the last page of this paperwork. If you have not heard from [place] regarding the results in 2 weeks, please contact this office.

## 2018-02-07 LAB — URINE CULTURE

## 2018-02-27 ENCOUNTER — Encounter: Payer: Self-pay | Admitting: Gynecology

## 2018-02-27 ENCOUNTER — Ambulatory Visit (INDEPENDENT_AMBULATORY_CARE_PROVIDER_SITE_OTHER): Payer: Medicare Other | Admitting: Gynecology

## 2018-02-27 VITALS — BP 124/80 | Ht 65.0 in | Wt 168.0 lb

## 2018-02-27 DIAGNOSIS — Z4689 Encounter for fitting and adjustment of other specified devices: Secondary | ICD-10-CM

## 2018-02-27 DIAGNOSIS — N952 Postmenopausal atrophic vaginitis: Secondary | ICD-10-CM | POA: Diagnosis not present

## 2018-02-27 DIAGNOSIS — N8111 Cystocele, midline: Secondary | ICD-10-CM | POA: Diagnosis not present

## 2018-02-27 DIAGNOSIS — N814 Uterovaginal prolapse, unspecified: Secondary | ICD-10-CM | POA: Diagnosis not present

## 2018-02-27 NOTE — Patient Instructions (Signed)
Office will call you when pessary is available.

## 2018-02-27 NOTE — Progress Notes (Signed)
    Dana Duffy 1946/01/24 [id number]        72 y.o.  [id number] new patient presents as a problem visit complaining of vaginal irritation, vaginal atrophy and possible prolapse.  Patient notes over the past month or so noticing rawness and rubbing particularly when walking and standing.  Recently had an evaluation with Juanda Crumble PA C and had a negative wet prep and urine analysis.  She has no history of GYN issues in the past.  No bleeding.  No vaginal dryness chronically.  Not sexually active.  Last Pap smear/HPV 2014.  Colonoscopy 2019.  Mammography 2018.  DEXA 2018.  Past medical history,surgical history, problem list, medications, allergies, family history and social history were all reviewed and documented in the EPIC chart.  Directed ROS with pertinent positives and negatives documented in the history of present illness/assessment and plan.  Exam: Caryn Bee assistant Vitals:   02/27/18 1414  BP: 124/80  Weight: 168 lb (76.2 kg)  Height: 5\' 5"  (1.651 m)   General appearance:  Normal Abdomen soft nontender without masses guarding rebound Pelvic external BUS vagina with atrophic changes.  Second-degree uterine prolapse with cervix to the level of the introitus with straining.  Second degree cystocele with bladder to the level of the introitus with straining.  No significant rectocele noted.  Cervix with atrophic changes.  Uterus normal size midline mobile nontender.  Adnexa without masses or tenderness.  Assessment/Plan:  72 y.o. [name] with uterine prolapse and cystocele.  Symptomatic to the patient most recently over the past month or so.  She does have a history of vaginal delivery x2 with last delivery 9 pounds.  We discussed options for management to include expectant, trial of pessary and surgery to include vaginal hysterectomy with cystocele repair possible sling/graft.  The pros and cons of each choice were reviewed with the patient.  At this point the patient is not  interested in pursuing surgery.  Would like a trial of pessary.  Subsequently a ring pessary with support #3 was fitted.  The patient ambulated squatted and did well without expulsion of the pessary and without having any discomfort.  I discussed the long-term care of a pessary and whether she would be able to remove and replace herself.  We will order her a pessary and she will follow-up when that returns for placement and then will go from there.  Greater than 50% of my 30-minute office visit was spent in direct face to face counseling and coordination of care with the patient.    Anastasio Auerbach MD, 3:06 PM 02/27/2018

## 2018-03-23 IMAGING — CT CT ABD-PELV W/ CM
2 of 5 series · 15 of 46 positions shown, 17 images · IV contrast (ISOVUE)
Comparison: Abdomen radiographs 05/25/2017. Ultrasound 10/25/2015.
Lumbar MRI 11/12/2014.

CLINICAL DATA: 71-year-old female with 3 weeks of lower abdominal
pain. Nausea. Prior cholecystectomy.

EXAM:
CT ABDOMEN AND PELVIS WITH CONTRAST
TECHNIQUE: Multidetector CT imaging of the abdomen and pelvis was performed
using the standard protocol following bolus administration of
intravenous contrast.
CONTRAST:  100 mL Asovue-S66

[Series 2: abd/pel with · axial · 0.71mm/px · z∈[+1060,+1410]mm · 12 of 80 slices shown, 14 images]
[im 5/80  soft-tissue]
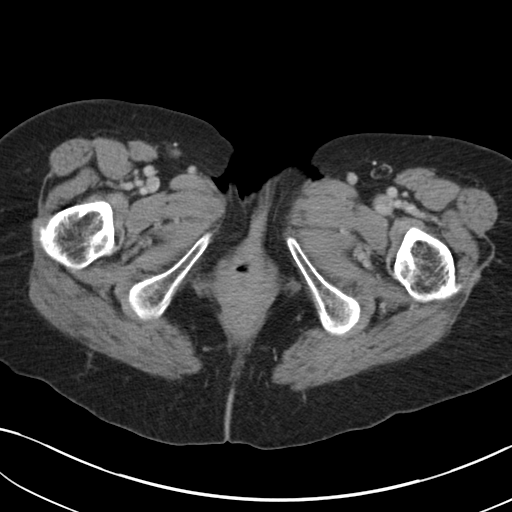
[im 5/80  bone]
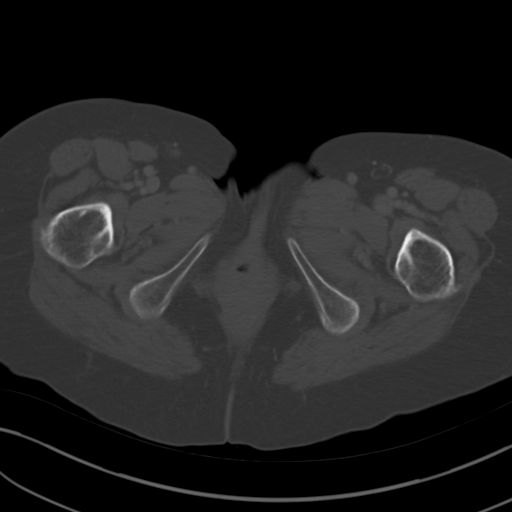
[im 10/80  soft-tissue]
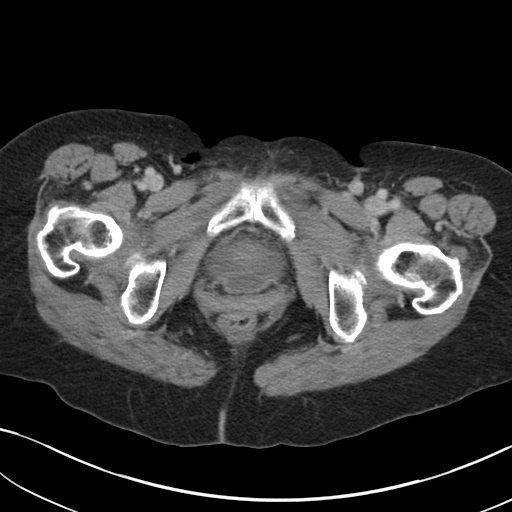
[im 20/80  soft-tissue]
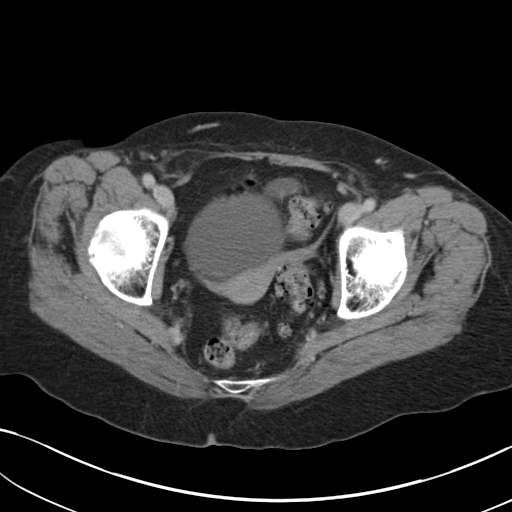
[im 25/80  soft-tissue]
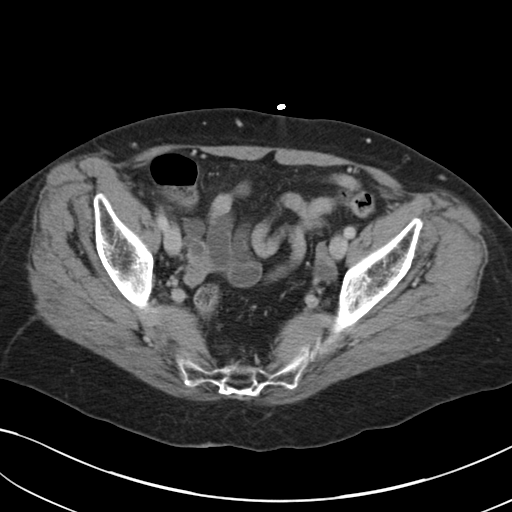
[im 30/80  soft-tissue]
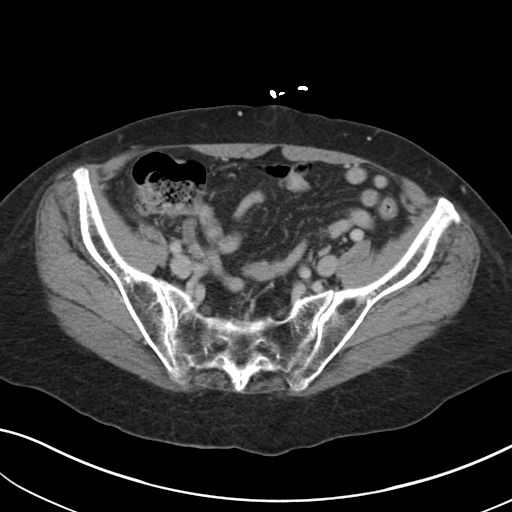
[im 35/80  soft-tissue]
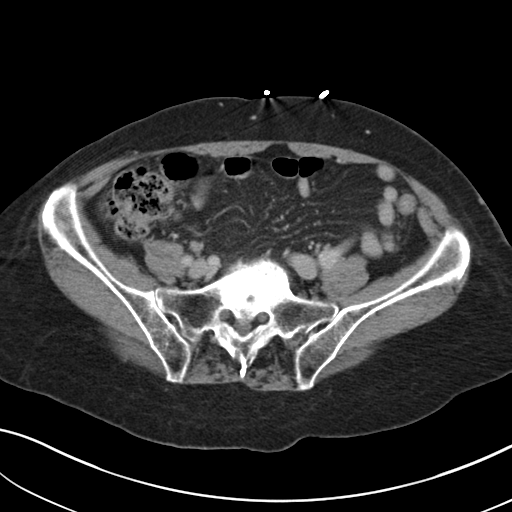
[im 45/80  soft-tissue]
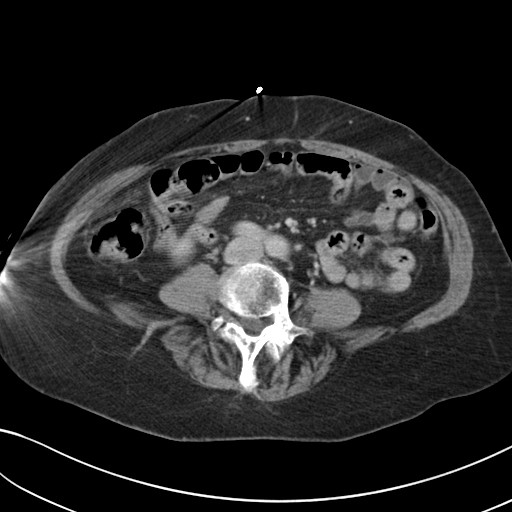
[im 50/80  soft-tissue]
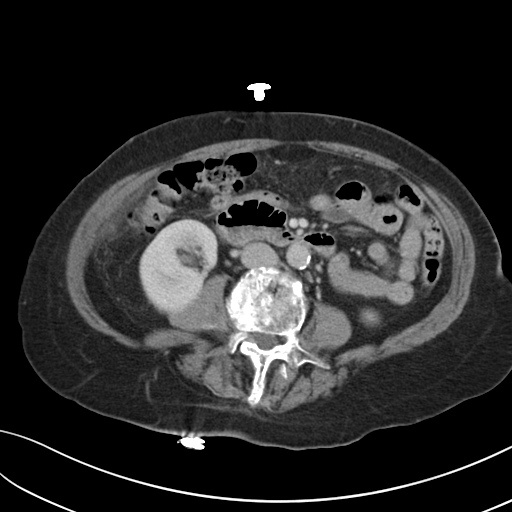
[im 55/80  soft-tissue]
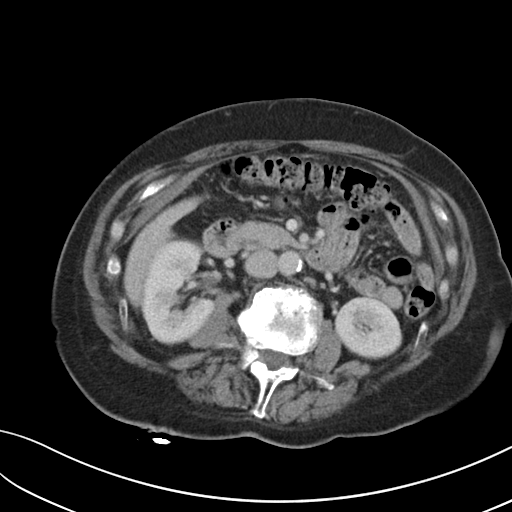
[im 55/80  bone]
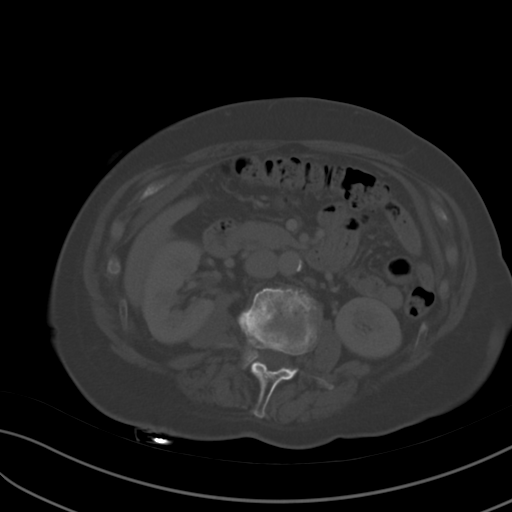
[im 60/80  soft-tissue]
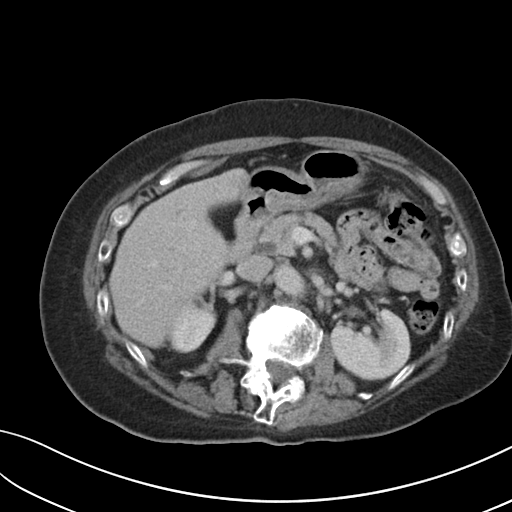
[im 70/80  soft-tissue]
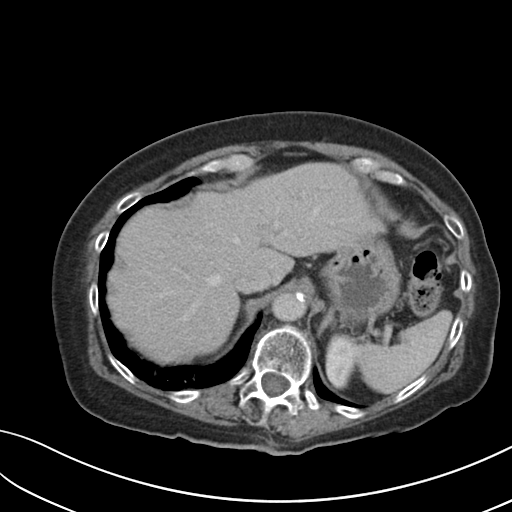
[im 75/80  soft-tissue]
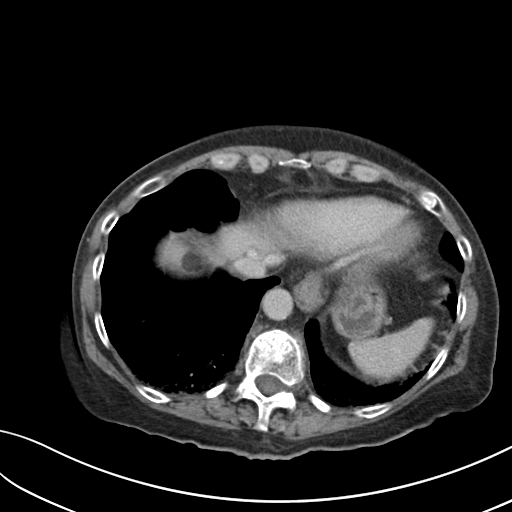

[Series 4: coronal a/|p · coronal · 0.76mm/px · 3 of 123 slices shown]
[im 41/123  soft-tissue]
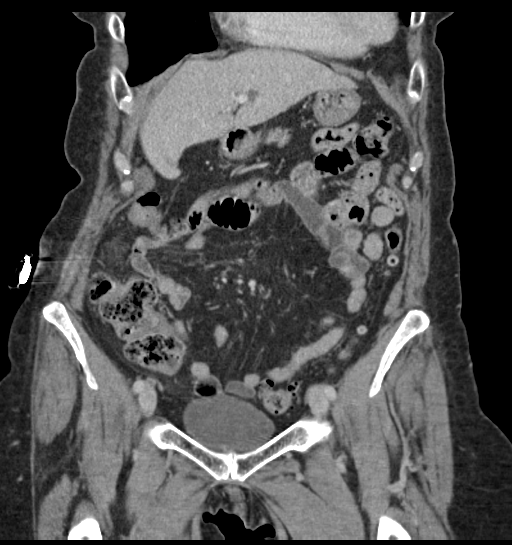
[im 55/123  soft-tissue]
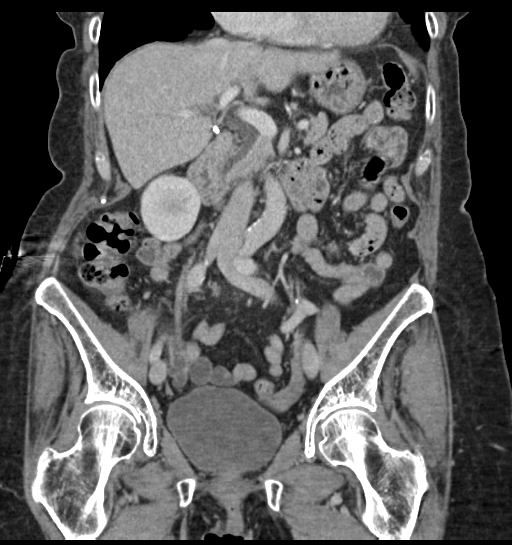
[im 68/123  soft-tissue]
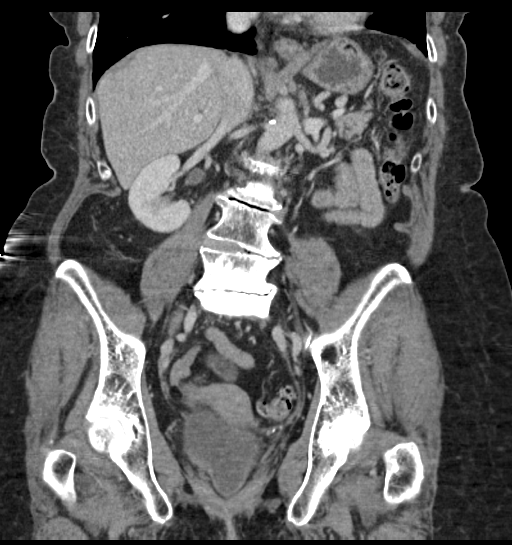

[15 of 46 positions shown; findings below may reference images not displayed]

FINDINGS: Lower chest: Mild cardiomegaly. No pericardial effusion. Dependent
lower lobe pulmonary opacity most resembling atelectasis. No pleural
effusion.

Hepatobiliary: Surgically absent gallbladder. Associated mild
prominence of the intra- and extrahepatic biliary ducts. There are
several small low-density areas in the liver, the largest measuring
15 mm at the right dome has simple fluid density. There is a smaller
lesion in the left hepatic lobe on series 2, image 15. On the 9813
ultrasound these areas appeared to be small benign cysts.

Pancreas: Negative.

Spleen: Negative.

Adrenals/Urinary Tract: Normal adrenal glands.

Bilateral renal enhancement and contrast excretion is within normal
limits. Pelvic floor laxity allowing for descent of the urinary
bladder (vesicocele). Otherwise unremarkable bladder.

Stomach/Bowel: Negative rectum. Moderate diverticulosis in the
sigmoid colon. No definite active inflammation. Mild diverticulosis
in the left colon with no inflammation. Similar volume of retained
stool throughout all colonic segments. Occasional diverticula at the
hepatic flexure.

The ileocecal valve is on series 2, image 47 and coronal image 42.
The appendix arises from the cecum on series 2, image 52 and coronal
image 46, and courses inferiorly appearing mildly to moderately
dilated with internal low density material 11-12 mm diameter. The
wall of the appendix also appears mildly thickened. However, there
is no surrounding inflammation.

There are some adjacent fluid-filled small bowel loops in the pelvis
which otherwise appear normal. Much of the remaining small bowel is
decompressed. Small gastric hiatal hernia. Otherwise negative
stomach. Duodenum is within normal limits.

No abdominal free fluid or free air.

Vascular/Lymphatic: Mild Calcified aortic atherosclerosis. Aortic
and iliac artery ectasia. Major arterial structures in the abdomen
and pelvis are patent. Portal venous system is patent.

No lymphadenopathy.

Reproductive: Within normal limits.

Other: Pelvic floor laxity.  No pelvic free fluid.

Musculoskeletal: Osteopenia. Levoconvex thoracolumbar scoliosis with
apex at L1-L2. Degenerative vertebral body sclerosis and endplate
spurring. Superior endplate compression fracture of T11 is new since
6139 but age indeterminate (sagittal image 92). Chronic T12 and L1
superior endplate Schmorl nodes. No other acute osseous abnormality
identified.
IMPRESSION: 1. The appendix appears abnormally dilated with low-density
material, but is NOT inflamed. The appearance is suspicious for
Mucocele of the Appendix. Recommend Gastroenterology and/or GI
Surgery follow-up for further evaluation.
2. Moderate T11 superior endplate compression fracture is age
indeterminate.
If specific therapy such as vertebroplasty is desired, Thoracic MRI
or Nuclear Medicine Whole-body Bone Scan would best determine
acuity.
3. Diverticulosis, especially the sigmoid colon. No active
inflammation.
4. Pelvic floor laxity and vesicocele.

## 2018-03-28 ENCOUNTER — Ambulatory Visit (INDEPENDENT_AMBULATORY_CARE_PROVIDER_SITE_OTHER): Payer: Medicare Other | Admitting: Gynecology

## 2018-03-28 ENCOUNTER — Encounter: Payer: Self-pay | Admitting: Gynecology

## 2018-03-28 VITALS — BP 120/76

## 2018-03-28 DIAGNOSIS — N814 Uterovaginal prolapse, unspecified: Secondary | ICD-10-CM | POA: Diagnosis not present

## 2018-03-28 NOTE — Patient Instructions (Signed)
Follow-up in 1 to 2 months for pessary check

## 2018-03-28 NOTE — Progress Notes (Signed)
,      Dana Duffy 09-17-1946 [id number]        72 y.o.  [id number] presents for pessary placement.  History of uterine prolapse with cystocele.  Had ring pessary with support #3 fitted and now presents to have this placed.  Past medical history,surgical history, problem list, medications, allergies, family history and social history were all reviewed and documented in the EPIC chart.  Directed ROS with pertinent positives and negatives documented in the history of present illness/assessment and plan.  Exam: Caryn Bee assistant Vitals:   03/28/18 1222  BP: 120/76   General appearance:  Normal Abdomen soft nontender without masses guarding rebound Pelvic external BUS vagina with atrophic changes.  Cervix at the level of the introitus.  Bimanual exam without masses or tenderness.  Ring pessary placed without difficulty.  Patient ambulated and did well with good support and no discomfort.  Assessment/Plan:  72 y.o. [id number] with successful placement of pessary.  Will follow-up in 1 to 2 months for pessary check.  I also instructed her about seeing if she cannot remove and replace this herself right before she comes in for her check.  She will call us sooner if she has any issues.    Anastasio Auerbach MD, 1:00 PM 03/28/2018

## 2018-04-26 ENCOUNTER — Ambulatory Visit: Payer: Medicare Other | Admitting: Physician Assistant

## 2018-04-26 ENCOUNTER — Encounter: Payer: Self-pay | Admitting: Physician Assistant

## 2018-04-26 ENCOUNTER — Encounter (HOSPITAL_COMMUNITY): Payer: Self-pay

## 2018-04-26 ENCOUNTER — Ambulatory Visit: Payer: Self-pay

## 2018-04-26 ENCOUNTER — Ambulatory Visit (HOSPITAL_COMMUNITY)
Admission: RE | Admit: 2018-04-26 | Discharge: 2018-04-26 | Disposition: A | Discharge status: Home / Self Care | Payer: Medicare Other | Source: Ambulatory Visit | Attending: Physician Assistant | Admitting: Physician Assistant

## 2018-04-26 VITALS — BP 143/81 | HR 76 | Temp 97.9°F | Resp 16 | Ht 65.0 in | Wt 165.8 lb

## 2018-04-26 DIAGNOSIS — G43A0 Cyclical vomiting, not intractable: Secondary | ICD-10-CM

## 2018-04-26 DIAGNOSIS — R1032 Left lower quadrant pain: Secondary | ICD-10-CM | POA: Diagnosis not present

## 2018-04-26 DIAGNOSIS — R82998 Other abnormal findings in urine: Secondary | ICD-10-CM

## 2018-04-26 DIAGNOSIS — R1030 Lower abdominal pain, unspecified: Secondary | ICD-10-CM

## 2018-04-26 DIAGNOSIS — R112 Nausea with vomiting, unspecified: Secondary | ICD-10-CM | POA: Diagnosis not present

## 2018-04-26 DIAGNOSIS — K5732 Diverticulitis of large intestine without perforation or abscess without bleeding: Secondary | ICD-10-CM

## 2018-04-26 LAB — POCT URINALYSIS DIP (MANUAL ENTRY)
BILIRUBIN UA: NEGATIVE mg/dL
Bilirubin, UA: NEGATIVE
Glucose, UA: NEGATIVE mg/dL
Nitrite, UA: NEGATIVE
Protein Ur, POC: NEGATIVE mg/dL
Urobilinogen, UA: 0.2 E.U./dL
pH, UA: 7 (ref 5.0–8.0)

## 2018-04-26 LAB — POCT CBC
GRANULOCYTE PERCENT: 72.1 % (ref 37–80)
HCT, POC: 44.2 % (ref 37.7–47.9)
HEMOGLOBIN: 14.4 g/dL (ref 12.2–16.2)
Lymph, poc: 1.7 (ref 0.6–3.4)
MCH: 30.8 pg (ref 27–31.2)
MCHC: 32.6 g/dL (ref 31.8–35.4)
MCV: 94.4 fL (ref 80–97)
MID (cbc): 0.6 (ref 0–0.9)
MPV: 8.1 fL (ref 0–99.8)
PLATELET COUNT, POC: 224 10*3/uL (ref 142–424)
POC Granulocyte: 6.1 (ref 2–6.9)
POC LYMPH PERCENT: 20.8 %L (ref 10–50)
POC MID %: 7.1 %M (ref 0–12)
RBC: 4.68 M/uL (ref 4.04–5.48)
RDW, POC: 13 %
WBC: 8.4 10*3/uL (ref 4.6–10.2)

## 2018-04-26 LAB — BASIC METABOLIC PANEL
Anion gap: 11 (ref 5–15)
BUN: 15 mg/dL (ref 8–23)
CALCIUM: 9.5 mg/dL (ref 8.9–10.3)
CO2: 26 mmol/L (ref 22–32)
CREATININE: 0.62 mg/dL (ref 0.44–1.00)
Chloride: 99 mmol/L (ref 98–111)
GFR calc non Af Amer: >60 mL/min (ref >60)
GLUCOSE: 110 mg/dL — AB (ref 70–99)
POTASSIUM: 4.3 mmol/L (ref 3.5–5.1)
Sodium: 136 mmol/L (ref 135–145)

## 2018-04-26 LAB — BUN+CREAT
BUN/Creatinine Ratio: 19 (ref 12–28)
BUN: 14 mg/dL (ref 8–27)
Creatinine, Ser: 0.74 mg/dL (ref 0.57–1.00)
GFR calc non Af Amer: 81 mL/min/{1.73_m2} (ref >59)
GFR, EST AFRICAN AMERICAN: 94 mL/min/{1.73_m2} (ref >59)

## 2018-04-26 MED ORDER — AMOXICILLIN-POT CLAVULANATE 875-125 MG PO TABS: 1.0000 | ORAL_TABLET | Freq: Two times a day (BID) | ORAL | 0 refills | Status: AC

## 2018-04-26 MED ORDER — IOPAMIDOL (ISOVUE-300) INJECTION 61%
100.0000 mL | Freq: Once | INTRAVENOUS | Status: AC | PRN
  Administered 2018-04-26: 100 mL via INTRAVENOUS

## 2018-04-26 NOTE — Progress Notes (Addendum)
04/26/2018 3:08 PM   DOB: 10-10-1946 / MRN: [id number]  SUBJECTIVE:  Dana Duffy is a 72 y.o. female presenting for left lower abdominal quadrant pain.  She has a history of diverticulosis but has never had diverticulitis. Symptoms present for about 3 days.  The problem is worsening. She has tried heating pack which helps.  She has having bowel movements most recently this morning.  She tells me she has a pessary and tried to take that out last night and this came out without difficulty and she was able to place the pessary back in and this was painless for her.  She is allergic to gluten meal and sulfa antibiotics.   She  has a past medical history of Allergy, Arthritis, Cholelithiases, Hypothyroidism, and Thyroid disease.    She  reports that she has never smoked. She has never used smokeless tobacco. She reports that she drinks about 1.2 oz of alcohol per week. She reports that she does not use drugs. She  reports that she does not currently engage in sexual activity. The patient  has a past surgical history that includes Bunionectomy; Cholecystectomy (N/A, 12/06/2015); and laparoscopic appendectomy (N/A, 06/12/2017).  Her family history includes Asthma in her son; Breast cancer (age of onset: 8) in her sister; Diabetes in her father; Heart disease in her mother; Hyperlipidemia in her maternal grandmother and mother; Hypertension in her mother.  Review of Systems  Constitutional: Negative for chills and fever.  Cardiovascular: Negative for chest pain.  Genitourinary: Negative for dysuria, flank pain, frequency, hematuria and urgency.  Neurological: Negative for dizziness.    The problem list and medications were reviewed and updated by myself where necessary and exist elsewhere in the encounter.   OBJECTIVE:  BP (!) 143/81   Pulse 76   Temp 97.9 F (36.6 C) (Oral)   Resp 16   Ht 5\' 5"  (1.651 m)   Wt 165 lb 12.8 oz (75.2 kg)   SpO2 99%   BMI 27.59 kg/m   Wt Readings from Last  3 Encounters:  04/26/18 165 lb 12.8 oz (75.2 kg)  02/27/18 168 lb (76.2 kg)  02/06/18 170 lb 6.4 oz (77.3 kg)   Temp Readings from Last 3 Encounters:  04/26/18 97.9 F (36.6 C) (Oral)  02/06/18 97.7 F (36.5 C) (Oral)  06/13/17 98 F (36.7 C) (Oral)   BP Readings from Last 3 Encounters:  04/26/18 (!) 143/81  03/28/18 120/76  02/27/18 124/80   Pulse Readings from Last 3 Encounters:  04/26/18 76  02/06/18 73  10/09/17 91    Physical Exam  Constitutional: She is oriented to person, place, and time. She appears well-nourished.  Non-toxic appearance. No distress.  Eyes: Pupils are equal, round, and reactive to light. EOM are normal.  Cardiovascular: Normal rate, regular rhythm, S1 normal, S2 normal, normal heart sounds and intact distal pulses. Exam reveals no gallop, no friction rub and no decreased pulses.  No murmur heard. Pulmonary/Chest: Effort normal. No stridor. No respiratory distress. She has no wheezes. She has no rales.  Abdominal: Normal appearance and bowel sounds are normal. She exhibits no distension. There is tenderness in the left upper quadrant. There is no rigidity, no rebound, no guarding, no CVA tenderness, no tenderness at McBurney's point and negative Murphy's sign.  Musculoskeletal: She exhibits no edema.  Neurological: She is alert and oriented to person, place, and time. No cranial nerve deficit. Gait normal.  Skin: Skin is warm and dry. She is not diaphoretic. No pallor.  Psychiatric: She has a normal mood and affect.  Vitals reviewed.   No results found for: HGBA1C  Lab Results  Component Value Date   WBC 8.4 04/26/2018   HGB 14.4 04/26/2018   HCT 44.2 04/26/2018   MCV 94.4 04/26/2018   PLT 163 06/12/2017    Lab Results  Component Value Date   CREATININE 0.62 04/26/2018   BUN 15 04/26/2018   NA 136 04/26/2018   K 4.3 04/26/2018   CL 99 04/26/2018   CO2 26 04/26/2018    Lab Results  Component Value Date   ALT 14 06/12/2017   AST 25  06/12/2017   ALKPHOS 76 06/12/2017   BILITOT 1.6 (H) 06/12/2017    Lab Results  Component Value Date   TSH 1.359 03/03/2015    Lab Results  Component Value Date   CHOL 176 03/03/2015   HDL 81 03/03/2015   LDLCALC 84 03/03/2015   TRIG 56 03/03/2015   CHOLHDL 2.2 03/03/2015   Ct Abdomen Pelvis W Contrast  Result Date: 04/26/2018 CLINICAL DATA:  Left lower quadrant abdominal pain and nausea, 3 days duration. History of diverticulosis. EXAM: CT ABDOMEN AND PELVIS WITH CONTRAST TECHNIQUE: Multidetector CT imaging of the abdomen and pelvis was performed using the standard protocol following bolus administration of intravenous contrast. CONTRAST:  14mL ISOVUE-300 IOPAMIDOL (ISOVUE-300) INJECTION 61% COMPARISON:  06/12/2017 FINDINGS: Lower chest: Normal Hepatobiliary: Normal except for a 1.5 cm cyst at the dome and a subcentimeter cyst in the left lobe. Previous cholecystectomy. Pancreas: Normal Spleen: Normal Adrenals/Urinary Tract: Adrenal glands are normal. Kidneys are normal. No cyst, mass, stone or hydronephrosis. Bladder is normal. Stomach/Bowel: Small bowel appears normal. There is diverticulosis of the left colon. There is acute diverticulitis in the sigmoid region with edema in the surrounding fat. No evidence of an abscess. Vascular/Lymphatic: Aortic atherosclerosis. No aneurysm. IVC is normal. No retroperitoneal adenopathy. Reproductive: Pessary within the vagina. Uterus and adnexal regions are normal. 1.6 cm cyst associated with the left ovary. Other: No free fluid or air. Musculoskeletal: Advanced lumbar spondylosis with disc space narrowing and sclerotic changes. Old appearing superior endplate depressions at T11, T12 and L1. IMPRESSION: Acute sigmoid diverticulitis. Inflammatory change without evidence of abscess or free air. Electronically Signed   By: Nelson Chimes M.D.   On: 04/26/2018 14:53     ASSESSMENT AND PLAN:  Lan was seen today for abdominal pain and nausea.  Diagnoses  and all orders for this visit:  Lower abdominal pain: I am mostly concerned with her complaints of nausea, left lower quadrant pain and mild tenderness on exam.  She has a history of  diverticulosis.  A CBC cannot be trusted.  Urine with leukocytes however no urinary symptoms. -     POCT urinalysis dipstick -     POCT CBC -     Renal Function Panel  Left lower quadrant pain -     CT Abdomen Pelvis W Contrast; Future  Nausea and vomiting, intractability of vomiting not specified, unspecified vomiting type -     CT Abdomen Pelvis W Contrast; Future  Urine leukocytes -     Urine Culture    The patient is advised to call or return to clinic if she does not see an improvement in symptoms, or to seek the care of the closest emergency department if she worsens with the above plan.   Philis Fendt, MHS, PA-C [facility] 04/26/2018 3:08 PM

## 2018-04-26 NOTE — Telephone Encounter (Signed)
Pt. Reports started having abdominal pain last night - low abdomen, radiates to her back and upper thighs. 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 Has not had any gluten to her knowledge. States she had a BM this morning and had some relief, but pain is back now. Pain is 6-7 on scale of 1-10.Instructed to be NPO until her appointment this morning.Verbalizes understanding.Instructed if pain increases to go to ED. Reason for Disposition . [1] MODERATE pain (e.g., interferes with normal activities) AND [2] pain comes and goes (cramps) AND [3] present > 24 hours  (Exception: pain with Vomiting or Diarrhea - see that Guideline)  Answer Assessment - Initial Assessment Questions 1. LOCATION: "Where does it hurt?"      Low abdomen 2. RADIATION: "Does the pain shoot anywhere else?" (e.g., chest, back)     To my back and upper thighs 3. ONSET: "When did the pain begin?" (e.g., minutes, hours or days ago)      Started last night 4. SUDDEN: "Gradual or sudden onset?"     Sudden 5. PATTERN "Does the pain come and go, or is it constant?"    - If constant: "Is it getting better, staying the same, or worsening?"      (Note: Constant means the pain never goes away completely; most serious pain is constant and it progresses)     - If intermittent: "How long does it last?" "Do you have pain now?"     (Note: Intermittent means the pain goes away completely between bouts)     Constant - cramping 6. SEVERITY: "How bad is the pain?"  (e.g., Scale 1-10; mild, moderate, or severe)   - MILD (1-3): doesn't interfere with normal activities, abdomen soft and not tender to touch    - MODERATE (4-7): interferes with normal activities or awakens from sleep, tender to touch    - SEVERE (8-10): excruciating pain, doubled over, unable to do any normal activities      6-7 7. RECURRENT SYMPTOM: "Have you ever had this type of abdominal pain before?" If so, ask: "When was the last time?" and  "What happened that time?"      yES 8. CAUSE: "What do you think is causing the abdominal pain?"     Unsure - 9. RELIEVING/AGGRAVATING FACTORS: "What makes it better or worse?" (e.g., movement, antacids, bowel movement)     BM helped a little 10. OTHER SYMPTOMS: "Has there been any vomiting, diarrhea, constipation, or urine problems?"       Loose BM is normal for her 11. PREGNANCY: "Is there any chance you are pregnant?" "When was your last menstrual period?"       No  Protocols used: ABDOMINAL PAIN - [facility]

## 2018-04-26 NOTE — Addendum Note (Signed)
Addended by: Tereasa Coop on: 04/26/2018 03:08 PM   Modules accepted: Orders

## 2018-04-26 NOTE — Patient Instructions (Addendum)
Please report to [facility] immediately for your STAT CT. Please do not eat or drink anything before your exam Remain there until your exam is over for further instructions.   IF you received an x-ray today, you will receive an invoice from [facility]. Please contact [facility] at [phone] with questions or concerns regarding your invoice.   IF you received labwork today, you will receive an invoice from [facility]. Please contact [facility] at [phone] with questions or concerns regarding your invoice.   Our billing staff will not be able to assist you with questions regarding bills from these companies.  You will be contacted with the lab results as soon as they are available. The fastest way to get your results is to activate your My Chart account. Instructions are located on the last page of this paperwork. If you have not heard from [place] regarding the results in 2 weeks, please contact this office.

## 2018-04-27 ENCOUNTER — Telehealth: Payer: Self-pay | Admitting: Physician Assistant

## 2018-04-27 LAB — RENAL FUNCTION PANEL
Albumin: 4.2 g/dL (ref 3.5–4.8)
BUN / CREAT RATIO: 19 (ref 12–28)
BUN: 15 mg/dL (ref 8–27)
CALCIUM: 9.9 mg/dL (ref 8.7–10.3)
CHLORIDE: 99 mmol/L (ref 96–106)
CO2: 23 mmol/L (ref 20–29)
Creatinine, Ser: 0.78 mg/dL (ref 0.57–1.00)
GFR calc non Af Amer: 76 mL/min/{1.73_m2} (ref >59)
GFR, EST AFRICAN AMERICAN: 88 mL/min/{1.73_m2} (ref >59)
GLUCOSE: 102 mg/dL — AB (ref 65–99)
POTASSIUM: 4.4 mmol/L (ref 3.5–5.2)
Phosphorus: 3.4 mg/dL (ref 2.5–4.5)
SODIUM: 138 mmol/L (ref 134–144)

## 2018-04-27 LAB — URINE CULTURE

## 2018-04-27 NOTE — Telephone Encounter (Signed)
Dana Duffy got patients results of her CT scan. Per Legrand Como, he wanted me to call pt and inform her that her CT came back positive for diverticulitis and that he called in an antibiotic to the pharmacy and for pt to follow up in 3 days. I spoke with pt on 04/26/18, pt was transferred to clerical staff and a follow up appointment was made. Pt had no additional questions to me

## 2018-04-29 ENCOUNTER — Encounter: Payer: Self-pay | Admitting: Physician Assistant

## 2018-04-29 ENCOUNTER — Other Ambulatory Visit: Payer: Self-pay

## 2018-04-29 ENCOUNTER — Ambulatory Visit: Payer: Medicare Other | Admitting: Physician Assistant

## 2018-04-29 VITALS — BP 117/65 | HR 78 | Temp 98.0°F | Resp 16 | Ht 65.0 in | Wt 166.0 lb

## 2018-04-29 DIAGNOSIS — K5732 Diverticulitis of large intestine without perforation or abscess without bleeding: Secondary | ICD-10-CM | POA: Diagnosis not present

## 2018-04-29 NOTE — Progress Notes (Signed)
04/29/2018 8:12 AM   DOB: 03-14-1946 / MRN: [id number]  SUBJECTIVE:  Dana Duffy is a 72 y.o. female presenting for recheck abdominal pain. Symptoms present for 7 days now.  The problem is improving. She has tried Augmentin.   Sent for CT scan by me and found to have acute diverticulitis despite negative septic picture in the office and a normal white count.  CT revealed no abscess or perforation and thus patient treated outpatient.  Denies fever, chills, nausea, diaphoresis, abdominal pain at this time.  She is allergic to gluten meal and sulfa antibiotics.   She  has a past medical history of Allergy, Arthritis, Cholelithiases, Hypothyroidism, and Thyroid disease.    She  reports that she has never smoked. She has never used smokeless tobacco. She reports that she drinks about 1.2 oz of alcohol per week. She reports that she does not use drugs. She  reports that she does not currently engage in sexual activity. The patient  has a past surgical history that includes Bunionectomy; Cholecystectomy (N/A, 12/06/2015); and laparoscopic appendectomy (N/A, 06/12/2017).  Her family history includes Asthma in her son; Breast cancer (age of onset: 28) in her sister; Diabetes in her father; Heart disease in her mother; Hyperlipidemia in her maternal grandmother and mother; Hypertension in her mother.  ROS per HPI  The problem list and medications were reviewed and updated by myself where necessary and exist elsewhere in the encounter.   OBJECTIVE:  BP 117/65   Pulse 78   Temp 98 F (36.7 C)   Resp 16   Ht 5\' 5"  (1.651 m)   Wt 166 lb (75.3 kg)   SpO2 98%   BMI 27.62 kg/m   Wt Readings from Last 3 Encounters:  04/29/18 166 lb (75.3 kg)  04/26/18 165 lb 12.8 oz (75.2 kg)  02/27/18 168 lb (76.2 kg)   Temp Readings from Last 3 Encounters:  04/29/18 98 F (36.7 C)  04/26/18 97.9 F (36.6 C) (Oral)  02/06/18 97.7 F (36.5 C) (Oral)   BP Readings from Last 3 Encounters:  04/29/18 117/65    04/26/18 (!) 143/81  03/28/18 120/76   Pulse Readings from Last 3 Encounters:  04/29/18 78  04/26/18 76  02/06/18 73    Physical Exam  Constitutional: She is oriented to person, place, and time. She appears well-nourished.  Non-toxic appearance. No distress.  Eyes: Pupils are equal, round, and reactive to light. EOM are normal.  Cardiovascular: Normal rate, regular rhythm, S1 normal, S2 normal, normal heart sounds and intact distal pulses. Exam reveals no gallop, no friction rub and no decreased pulses.  No murmur heard. Pulmonary/Chest: Effort normal. No stridor. No respiratory distress. She has no wheezes. She has no rales.  Abdominal: Soft. Normal appearance and bowel sounds are normal. She exhibits no distension and no mass. There is no tenderness. There is no rigidity, no rebound, no guarding and no CVA tenderness.  Musculoskeletal: She exhibits no edema.  Neurological: She is alert and oriented to person, place, and time. No cranial nerve deficit. Gait normal.  Skin: Skin is warm and dry. She is not diaphoretic. No pallor.  Psychiatric: She has a normal mood and affect.  Vitals reviewed.   No results found for: HGBA1C  Lab Results  Component Value Date   WBC 8.4 04/26/2018   HGB 14.4 04/26/2018   HCT 44.2 04/26/2018   MCV 94.4 04/26/2018   PLT 163 06/12/2017    Lab Results  Component Value Date  CREATININE 0.62 04/26/2018   BUN 15 04/26/2018   NA 136 04/26/2018   K 4.3 04/26/2018   CL 99 04/26/2018   CO2 26 04/26/2018    Lab Results  Component Value Date   ALT 14 06/12/2017   AST 25 06/12/2017   ALKPHOS 76 06/12/2017   BILITOT 1.6 (H) 06/12/2017    Lab Results  Component Value Date   TSH 1.359 03/03/2015    Lab Results  Component Value Date   CHOL 176 03/03/2015   HDL 81 03/03/2015   LDLCALC 84 03/03/2015   TRIG 56 03/03/2015   CHOLHDL 2.2 03/03/2015     ASSESSMENT AND PLAN:  Dana Duffy was seen today for diverticulitis.  Diagnoses and all  orders for this visit:  Diverticulitis of colon Comments: Resolving continue course.    The patient is advised to call or return to clinic if she does not see an improvement in symptoms, or to seek the care of the closest emergency department if she worsens with the above plan.   Philis Fendt, MHS, PA-C [facility] 04/29/2018 8:12 AM

## 2018-04-29 NOTE — Patient Instructions (Signed)
     IF you received an x-ray today, you will receive an invoice from [facility]. Please contact [facility] at [phone] with questions or concerns regarding your invoice.   IF you received labwork today, you will receive an invoice from [facility]. Please contact [facility] at [phone] with questions or concerns regarding your invoice.   Our billing staff will not be able to assist you with questions regarding bills from these companies.  You will be contacted with the lab results as soon as they are available. The fastest way to get your results is to activate your My Chart account. Instructions are located on the last page of this paperwork. If you have not heard from us regarding the results in 2 weeks, please contact this office.     

## 2019-07-31 ENCOUNTER — Encounter: Payer: Self-pay | Admitting: Gynecology

## 2019-11-03 ENCOUNTER — Ambulatory Visit
Admission: RE | Admit: 2019-11-03 | Discharge: 2019-11-03 | Disposition: A | Discharge status: Home / Self Care | Payer: Medicare Other | Source: Ambulatory Visit | Attending: Family Medicine | Admitting: Family Medicine

## 2019-11-03 ENCOUNTER — Other Ambulatory Visit: Payer: Self-pay | Admitting: Family Medicine

## 2019-11-03 DIAGNOSIS — M25561 Pain in right knee: Secondary | ICD-10-CM

## 2019-11-30 ENCOUNTER — Ambulatory Visit: Payer: Medicare Other | Attending: Internal Medicine

## 2019-11-30 DIAGNOSIS — Z23 Encounter for immunization: Secondary | ICD-10-CM

## 2019-11-30 NOTE — Progress Notes (Signed)
   Covid-19 Vaccination Clinic  Name:  Dana Duffy    MRN: [id number] DOB: January 03, 1946  11/30/2019  [name] was observed post Covid-19 immunization for 15 minutes without incidence. She was provided with Vaccine Information Sheet and instruction to access the V-Safe system.   [name] was instructed to call 911 with any severe reactions post vaccine: Marland Kitchen Difficulty breathing  . Swelling of your face and throat  . A fast heartbeat  . A bad rash all over your body  . Dizziness and weakness    Immunizations Administered    Name Date Dose VIS Date Route   Pfizer COVID-19 Vaccine 11/30/2019  3:53 PM 0.3 mL 10/03/2019 Intramuscular   Manufacturer: [place]   Lot: [id number]   [place]: [id number]

## 2019-12-18 ENCOUNTER — Ambulatory Visit: Payer: Medicare Other

## 2019-12-25 ENCOUNTER — Ambulatory Visit: Payer: Medicare Other | Attending: Internal Medicine

## 2019-12-25 DIAGNOSIS — Z23 Encounter for immunization: Secondary | ICD-10-CM | POA: Insufficient documentation

## 2019-12-25 NOTE — Progress Notes (Signed)
   Covid-19 Vaccination Clinic  Name:  Burgandy Fluellen    MRN: [id number] DOB: 1945-12-09  12/25/2019  Ms. Coyle was observed post Covid-19 immunization for 15 minutes without incident. She was provided with Vaccine Information Sheet and instruction to access the V-Safe system.   Ms. Dicello was instructed to call 911 with any severe reactions post vaccine: Marland Kitchen Difficulty breathing  . Swelling of face and throat  . A fast heartbeat  . A bad rash all over body  . Dizziness and weakness   Immunizations Administered    Name Date Dose VIS Date Route   Pfizer COVID-19 Vaccine 12/25/2019  9:08 AM 0.3 mL 10/03/2019 Intramuscular   Manufacturer: [place]   Lot: [id number]   [place]: [id number]

## 2023-07-24 ENCOUNTER — Other Ambulatory Visit (HOSPITAL_COMMUNITY): Payer: Self-pay | Admitting: Family Medicine

## 2023-07-24 DIAGNOSIS — R011 Cardiac murmur, unspecified: Secondary | ICD-10-CM

## 2023-08-22 ENCOUNTER — Ambulatory Visit (HOSPITAL_COMMUNITY): Payer: Medicare Other | Attending: Family Medicine

## 2023-08-22 DIAGNOSIS — R011 Cardiac murmur, unspecified: Secondary | ICD-10-CM | POA: Insufficient documentation

## 2023-08-22 LAB — ECHOCARDIOGRAM COMPLETE
Area-P 1/2: 2.93 cm2
S' Lateral: 2.9 cm

## 2023-09-13 ENCOUNTER — Emergency Department (HOSPITAL_COMMUNITY): Payer: Medicare Other

## 2023-09-13 ENCOUNTER — Other Ambulatory Visit: Payer: Self-pay

## 2023-09-13 ENCOUNTER — Encounter (HOSPITAL_COMMUNITY): Payer: Self-pay

## 2023-09-13 ENCOUNTER — Emergency Department (HOSPITAL_COMMUNITY)
Admission: EM | Admit: 2023-09-13 | Discharge: 2023-09-13 | Disposition: A | Discharge status: Home / Self Care | Payer: Medicare Other | Attending: Emergency Medicine | Admitting: Emergency Medicine

## 2023-09-13 DIAGNOSIS — I10 Essential (primary) hypertension: Secondary | ICD-10-CM | POA: Diagnosis not present

## 2023-09-13 DIAGNOSIS — E039 Hypothyroidism, unspecified: Secondary | ICD-10-CM | POA: Diagnosis not present

## 2023-09-13 DIAGNOSIS — R413 Other amnesia: Secondary | ICD-10-CM | POA: Diagnosis not present

## 2023-09-13 DIAGNOSIS — R410 Disorientation, unspecified: Secondary | ICD-10-CM | POA: Diagnosis not present

## 2023-09-13 DIAGNOSIS — N3000 Acute cystitis without hematuria: Secondary | ICD-10-CM | POA: Diagnosis present

## 2023-09-13 DIAGNOSIS — G454 Transient global amnesia: Secondary | ICD-10-CM | POA: Insufficient documentation

## 2023-09-13 LAB — URINALYSIS, ROUTINE W REFLEX MICROSCOPIC
Bilirubin Urine: NEGATIVE
Glucose, UA: NEGATIVE mg/dL
Ketones, ur: 20 mg/dL — AB
Nitrite: POSITIVE — AB
Protein, ur: NEGATIVE mg/dL
Specific Gravity, Urine: 1.025 (ref 1.005–1.030)
WBC, UA: >50 WBC/hpf (ref 0–5)
pH: 5 (ref 5.0–8.0)

## 2023-09-13 LAB — CBC
HCT: 45.3 % (ref 36.0–46.0)
Hemoglobin: 14.7 g/dL (ref 12.0–15.0)
MCH: 31.5 pg (ref 26.0–34.0)
MCHC: 32.5 g/dL (ref 30.0–36.0)
MCV: 97.2 fL (ref 80.0–100.0)
Platelets: 227 10*3/uL (ref 150–400)
RBC: 4.66 MIL/uL (ref 3.87–5.11)
RDW: 12.8 % (ref 11.5–15.5)
WBC: 7 10*3/uL (ref 4.0–10.5)
nRBC: 0 % (ref 0.0–0.2)

## 2023-09-13 LAB — COMPREHENSIVE METABOLIC PANEL
ALT: 15 U/L (ref 0–44)
AST: 27 U/L (ref 15–41)
Albumin: 4 g/dL (ref 3.5–5.0)
Alkaline Phosphatase: 85 U/L (ref 38–126)
Anion gap: 9 (ref 5–15)
BUN: 25 mg/dL — ABNORMAL HIGH (ref 8–23)
CO2: 23 mmol/L (ref 22–32)
Calcium: 9.6 mg/dL (ref 8.9–10.3)
Chloride: 103 mmol/L (ref 98–111)
Creatinine, Ser: 0.7 mg/dL (ref 0.44–1.00)
GFR, Estimated: >60 mL/min (ref >60)
Glucose, Bld: 108 mg/dL — ABNORMAL HIGH (ref 70–99)
Potassium: 3.5 mmol/L (ref 3.5–5.1)
Sodium: 135 mmol/L (ref 135–145)
Total Bilirubin: 1.4 mg/dL — ABNORMAL HIGH (ref <1.2)
Total Protein: 7.7 g/dL (ref 6.5–8.1)

## 2023-09-13 MED ORDER — LORAZEPAM 2 MG/ML IJ SOLN
1.0000 mg | Freq: Once | INTRAMUSCULAR | Status: AC | PRN
  Administered 2023-09-13: 1 mg via INTRAVENOUS
  Filled 2023-09-13: qty 1

## 2023-09-13 MED ORDER — SODIUM CHLORIDE 0.9 % IV BOLUS: 1000.0000 mL | Freq: Once | INTRAVENOUS | Status: DC

## 2023-09-13 MED ORDER — CEPHALEXIN 500 MG PO CAPS: 500.0000 mg | ORAL_CAPSULE | Freq: Three times a day (TID) | ORAL | 0 refills | Status: DC

## 2023-09-13 MED ORDER — SODIUM CHLORIDE 0.9 % IV SOLN
1.0000 g | Freq: Once | INTRAVENOUS | Status: AC
  Administered 2023-09-13: 1 g via INTRAVENOUS
  Filled 2023-09-13: qty 10

## 2023-09-13 NOTE — ED Triage Notes (Addendum)
Family reports memory loss that they noticed at 7am.  Per family patient was suppose to pick a family friend kid to take to school this am and forgot and family reports talking to patient and patient doesn't recall taking to the kids father.  Patient has been taking this family friends kid to school for while and patient doesn't remember ever taking her.  Patient is A&O to self, place, and year but when ask month she states "April".

## 2023-09-13 NOTE — Discharge Instructions (Addendum)
Take Keflex 500 mg 3 times daily for 5 days for UTI.  As we discussed your MRI showed no stroke and your labs are normal today  See your doctor for follow-up.  Consider following up with neurology if you have persistent gaps of memory  Return to ER if you have severe abdominal pain or fever or vomiting or trouble speaking or numbness or weakness

## 2023-09-13 NOTE — ED Provider Notes (Signed)
Dana Duffy Provider Note   CSN: [id number] Arrival date & time: 09/13/23  1051     History  No chief complaint on file.   Dana Duffy is a 77 y.o. female history of hypertension, hypothyroidism here presenting with memory loss.  Patient states that she woke up this morning and she thought it was 8:30 AM.  Per family she woke up around 7 AM.  Was supposed to pick up a family friend's kid to take him to school this morning but she apparently forgot.  Her son eventually called her and noticed that she was just very confused.  Patient did not remember anything from this morning.  She remember going to bed around 10 PM last night and breathing was normal at that time.  Patient is under a lot of financial stress.  Patient adamantly denies taking too much medications.  Has no history of strokes in the past.  Patient is not on blood thinners.  The history is provided by the patient.       Home Medications Prior to Admission medications   Medication Sig Start Date End Date Taking? Authorizing Provider  acidophilus (RISAQUAD) CAPS Take 1 capsule by mouth daily.     [provider]  cholecalciferol (VITAMIN D) 1000 units tablet Take 1,000 Units by mouth daily.    [provider]  levothyroxine (SYNTHROID, LEVOTHROID) 100 MCG tablet TAKE 1 TABLET ONCE DAILY. 03/30/16   Tonye Pearson, MD  losartan-hydrochlorothiazide (HYZAAR) 50-12.5 MG tablet Take 0.5 tablets by mouth daily.  04/22/17   [provider]  simvastatin (ZOCOR) 20 MG tablet TAKE ONE TABLET AT BEDTIME. 03/30/16   Tonye Pearson, MD      Allergies    Gatifloxacin, Gluten meal, and Sulfa antibiotics    Review of Systems   Review of Systems  Psychiatric/Behavioral:  Positive for confusion.   All other systems reviewed and are negative.   Physical Exam Updated Vital Signs BP (!) 150/84   Pulse 75   Temp 97.9 F (36.6 C) (Oral)   Resp 16   Ht 5'  5" (1.651 m)   Wt 78 kg   SpO2 97%   BMI 28.62 kg/m  Physical Exam Vitals and nursing note reviewed.  Constitutional:      Comments: Slightly forgetful but well appearing   HENT:     Head: Normocephalic.     Nose: Nose normal.     Mouth/Throat:     Mouth: Mucous membranes are moist.  Eyes:     Extraocular Movements: Extraocular movements intact.     Pupils: Pupils are equal, round, and reactive to light.  Cardiovascular:     Rate and Rhythm: Normal rate and regular rhythm.     Pulses: Normal pulses.     Heart sounds: Normal heart sounds.  Pulmonary:     Effort: Pulmonary effort is normal.     Breath sounds: Normal breath sounds.  Abdominal:     General: Abdomen is flat.     Palpations: Abdomen is soft.  Musculoskeletal:        General: Normal range of motion.     Cervical back: Normal range of motion and neck supple.  Skin:    General: Skin is warm.     Capillary Refill: Capillary refill takes less than 2 seconds.  Neurological:     General: No focal deficit present.     Mental Status: She is oriented to person, place, and time.  Comments: Cranial nerves II to XII intact.  Normal strength bilateral arms and legs.  Psychiatric:        Mood and Affect: Mood normal.        Behavior: Behavior normal.     ED Results / Procedures / Treatments   Labs (all labs ordered are listed, but only abnormal results are displayed) Labs Reviewed  COMPREHENSIVE METABOLIC PANEL - Abnormal; Notable for the following components:      Result Value   Glucose, Bld 108 (*)    BUN 25 (*)    Total Bilirubin 1.4 (*)    All other components within normal limits  URINALYSIS, ROUTINE W REFLEX MICROSCOPIC - Abnormal; Notable for the following components:   APPearance HAZY (*)    Hgb urine dipstick MODERATE (*)    Ketones, ur 20 (*)    Nitrite POSITIVE (*)    Leukocytes,Ua LARGE (*)    Bacteria, UA MANY (*)    All other components within normal limits  URINE CULTURE  CBC  URINALYSIS,  W/ REFLEX TO CULTURE (INFECTION SUSPECTED)  RAPID URINE DRUG SCREEN, HOSP PERFORMED    EKG None  Radiology No results found.  Procedures Procedures    Medications Ordered in ED Medications  cefTRIAXone (ROCEPHIN) 1 g in sodium chloride 0.9 % 100 mL IVPB (has no administration in time range)  LORazepam (ATIVAN) injection 1 mg (has no administration in time range)    ED Course/ Medical Decision Making/ A&P                                 Medical Decision Making Dana Duffy is a 77 y.o. female here presenting with forgetfulness.  I think likely transient global amnesia.  Patient is under a lot of financial stress.  Will get MRI to rule out stroke and get labs.  Patient does have recurrent UTI so we will check urinalysis.  10:03 PM I reviewed patient's labs and they were unremarkable.  UA showed many bacteria and large leuks.  MRI did not show any stroke.  Patient is feeling better and started to remember what happened.  I think she most likely have transient global amnesia.  I discussed case with Dr. Selina Cooley from neurology and she agreed that patient is stable for discharge.   Problems Addressed: Acute cystitis without hematuria: acute illness or injury Transient global amnesia: acute illness or injury  Amount and/or Complexity of Data Reviewed Labs: ordered. Decision-making details documented in ED Course. Radiology: ordered and independent interpretation performed. Decision-making details documented in ED Course.  Risk Prescription drug management.    Final Clinical Impression(s) / ED Diagnoses Final diagnoses:  None    Rx / DC Orders ED Discharge Orders     None         Charlynne Pander, MD 09/13/23 2205

## 2023-09-15 LAB — URINE CULTURE: Culture: 60000 — AB

## 2023-09-16 ENCOUNTER — Telehealth (HOSPITAL_BASED_OUTPATIENT_CLINIC_OR_DEPARTMENT_OTHER): Payer: Self-pay | Admitting: *Deleted

## 2023-09-16 NOTE — Telephone Encounter (Signed)
Post ED Visit - Positive Culture Follow-up  Culture report reviewed by antimicrobial stewardship pharmacist: [facility] []  Enzo Bi, Pharm.D. []  Celedonio Miyamoto, Pharm.D., BCPS AQ-ID []  Garvin Fila, Pharm.D., BCPS []  Georgina Pillion, [address].D., BCPS []  [place], [address].D., BCPS, AAHIVP []  Estella Husk, Pharm.D., BCPS, AAHIVP []  Lysle Pearl, PharmD, BCPS []  Phillips Climes, PharmD, BCPS []  Agapito Games, PharmD, BCPS []  Verlan Friends, PharmD []  Mervyn Gay, PharmD, BCPS []  Vinnie Level, PharmD  [facility] []  Len Childs, PharmD []  Greer Pickerel, PharmD []  Adalberto Cole, PharmD []  Perlie Gold, Rph []  Lonell Face) Jean Rosenthal, PharmD []  Earl Many, PharmD []  Junita Push, PharmD []  Dorna Leitz, PharmD []  Terrilee Files, PharmD []  Lynann Beaver, PharmD []  Keturah Barre, PharmD []  Loralee Pacas, PharmD [x]  Laureen Ochs, PharmD   Positive urine culture Treated with Cephalexin, organism sensitive to the same and no further patient follow-up is required at this time.  Patsey Berthold 09/16/2023, 8:19 AM

## 2024-03-12 ENCOUNTER — Other Ambulatory Visit (INDEPENDENT_AMBULATORY_CARE_PROVIDER_SITE_OTHER)

## 2024-03-12 ENCOUNTER — Ambulatory Visit: Admitting: Orthopaedic Surgery

## 2024-03-12 DIAGNOSIS — M25551 Pain in right hip: Secondary | ICD-10-CM

## 2024-03-12 DIAGNOSIS — M1611 Unilateral primary osteoarthritis, right hip: Secondary | ICD-10-CM | POA: Insufficient documentation

## 2024-03-12 NOTE — Progress Notes (Signed)
 Office Visit Note   Patient: Dana Duffy           Date of Birth: 04/23/1946           MRN: [id number] Visit Date: 03/12/2024              Requested by: Dana Aden, NP [address] [place],  [place] [address] PCP: Dana Leghorn, MD   Assessment & Plan: Visit Diagnoses:  1. Primary osteoarthritis of right hip     Plan: History of Present Illness Dana Duffy is a 78 year old female who presents with hip pain and mobility issues.  Right hip pain began in October 2024, wrapping around the groin and sometimes radiating from the back. A cortisone injection in March 2025 provided partial relief. Pain previously disrupted sleep before the injection. She experiences difficulty lifting her leg onto high surfaces and often assists manually. Pain occurs when rolling her leg back and forth and during internal rotation. Walking long distances is challenging, and she sometimes has difficulty ascending steps. 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  Physical Exam MUSCULOSKELETAL: No tenderness over the hip bursa. Pain on hip internal rotation. No pain on hip flexion.  Pain with logroll.  Assessment and Plan End-stage osteoarthritis of right hip Chronic osteoarthritis with bone-on-bone changes causing significant pain and functional limitations. Conservative treatments inadequate including PT, activity modification, intra-articular steroid injection, medications. Hip replacement recommended due to severe degeneration and quality of life impact. Surgery delayed 90 days post-cortisone injection to reduce infection risk. Informed about procedure, recovery, and outcomes. Considered work schedule, living situation, and support system in decision-making. - Delay hip replacement surgery for 90 days post-cortisone injection. - Obtain surgical clearance from  Dana Duffy. - Schedule hip replacement post-clearance. - Provide educational materials on hip replacement. - Discuss recovery expectations and outcomes.  Follow-Up Instructions: No follow-ups on file.   Orders:  Orders Placed This Encounter  Procedures   XR HIP UNILAT W OR W/O PELVIS 2-3 VIEWS RIGHT   No orders of the defined types were placed in this encounter.  Subjective: Chief Complaint  Patient presents with   Right Hip - Pain    HPI  Review of Systems  Constitutional: Negative.   HENT: Negative.    Eyes: Negative.   Respiratory: Negative.    Cardiovascular: Negative.   Endocrine: Negative.   Musculoskeletal: Negative.   Neurological: Negative.   Hematological: Negative.   Psychiatric/Behavioral: Negative.    All other systems reviewed and are negative.    Objective: Vital Signs: There were no vitals taken for this visit.  Physical Exam Vitals and nursing note reviewed.  Constitutional:      Appearance: She is well-developed.  HENT:     Head: Atraumatic.     Nose: Nose normal.  Eyes:     Extraocular Movements: Extraocular movements intact.  Cardiovascular:     Pulses: Normal pulses.  Pulmonary:     Effort: Pulmonary effort is normal.  Abdominal:     Palpations: Abdomen is soft.  Musculoskeletal:     Cervical back: Neck supple.  Skin:    General: Skin is warm.     Capillary Refill: Capillary refill takes less than 2 seconds.  Neurological:     Mental Status: She is alert. Mental status is at baseline.  Psychiatric:        Behavior:  Behavior normal.        Thought Content: Thought content normal.        Judgment: Judgment normal.     Ortho Exam  Specialty Comments:  No specialty comments available.  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   BMI 27.0-27.9,adult 06/12/2012   Hypothyroidism 06/12/2012   Hyperlipidemia 06/12/2012   Past Medical History:  Diagnosis Date   Allergy    Arthritis    Cholelithiases    Hypothyroidism    Thyroid disease     Family History  Problem Relation Age of Onset   Heart disease Mother    Hyperlipidemia Mother    Hypertension Mother    Asthma Son    Hyperlipidemia Maternal Grandmother    Diabetes Father    Breast cancer Sister 7    Past Surgical History:  Procedure Laterality Date   BUNIONECTOMY     CHOLECYSTECTOMY N/A 12/06/2015   Procedure: LAPAROSCOPIC CHOLECYSTECTOMY;  Surgeon: Dana Harry, MD;  Location: [facility];  Service: General;  Laterality: N/A;   LAPAROSCOPIC APPENDECTOMY N/A 06/12/2017   Procedure: APPENDECTOMY LAPAROSCOPIC;  Surgeon: Dana Norlander, MD;  Location: [facility];  Service: General;  Laterality: N/A;   Social History   Occupational History   Occupation: Artist  Tobacco Use   Smoking status: Never   Smokeless tobacco: Never  Vaping Use   Vaping status: Never Used  Substance and Sexual Activity   Alcohol use: Yes    Alcohol/week: 2.0 standard drinks of alcohol    Types: 2 Glasses of wine per week    Comment: occas   Drug use: No   Sexual activity: Not Currently    Comment: 1st intercourse 78 yo-Fewer than 5 partners

## 2024-05-07 ENCOUNTER — Encounter: Payer: Self-pay | Admitting: Orthopaedic Surgery

## 2024-05-18 NOTE — Telephone Encounter (Signed)
 She wants to reschedule to another date.

## 2024-06-24 ENCOUNTER — Encounter (HOSPITAL_COMMUNITY): Payer: Self-pay

## 2024-06-24 NOTE — Patient Instructions (Signed)
 SURGICAL WAITING ROOM VISITATION  Patients having surgery or a procedure may have no more than 2 support people in the waiting area - these visitors may rotate.    Children under the age of 2 must have an adult with them who is not the patient.  Visitors with respiratory illnesses are discouraged from visiting and should remain at home.  If the patient needs to stay at the hospital during part of their recovery, the visitor guidelines for inpatient rooms apply. Pre-op nurse will coordinate an appropriate time for 1 support person to accompany patient in pre-op.  This support person may not rotate.    Please refer to the [facility] website for the visitor guidelines for Inpatients (after your surgery is over and you are in a regular room).       Your procedure is scheduled on: 07-03-24   Report to [facility] Main Entrance    Report to admitting at      0515  AM   Call this number if you have problems the morning of surgery [phone]   Do not eat food :After Midnight.   After Midnight you may have the following liquids until _0430 _____ AM/  DAY OF SURGERY    then nothing by mouth  Water Non-Citrus Juices (without pulp, NO RED-Apple, White grape, White cranberry) Black Coffee (NO MILK/CREAM OR CREAMERS, sugar ok)  Clear Tea (NO MILK/CREAM OR CREAMERS, sugar ok) regular and decaf                             Plain Jell-O (NO RED)                                           Fruit ices (not with fruit pulp, NO RED)                                     Popsicles (NO RED)                                                               Sports drinks like Gatorade (NO RED)                    The day of surgery:  Drink ONE (1) Pre-Surgery Clear Ensure BY 0430  AM the morning of surgery. Drink in one sitting. Do not sip.  This drink was given to you during your hospital  pre-op appointment visit. Nothing else to drink after completing the  Pre-Surgery Clear Ensure .           If you have questions, please contact your surgeon's office.   FOLLOW  ANY ADDITIONAL PRE OP INSTRUCTIONS YOU RECEIVED FROM YOUR SURGEON'S OFFICE!!!     Oral Hygiene is also important to reduce your risk of infection.                                    Remember - BRUSH YOUR TEETH THE MORNING  OF SURGERY WITH YOUR REGULAR TOOTHPASTE  DENTURES WILL BE REMOVED PRIOR TO SURGERY PLEASE DO NOT APPLY Poly grip OR ADHESIVES!!!   Do NOT smoke after Midnight   Stop all vitamins and herbal supplements 7 days before surgery.   Take these medicines the morning of surgery with A SIP OF WATER: levothyroxine     Bring CPAP mask and tubing day of surgery.                              You may not have any metal on your body including hair pins, jewelry, and body piercing             Do not wear make-up, lotions, powders, perfumes/cologne, or deodorant  Do not wear nail polish including gel and S&S, artificial/acrylic nails, or any other type of covering on natural nails including finger and toenails. If you have artificial nails, gel coating, etc. that needs to be removed by a nail salon please have this removed prior to surgery or surgery may need to be canceled/ delayed if the surgeon/ anesthesia feels like they are unable to be safely monitored.   Do not shave 5 days  prior to surgery.              Do not bring valuables to the hospital. [facility] IS NOT             RESPONSIBLE   FOR VALUABLES.   Contacts, glasses, dentures or bridgework may not be worn into surgery.   Bring small overnight bag day of surgery.   DO NOT BRING YOUR HOME MEDICATIONS TO THE HOSPITAL. PHARMACY WILL DISPENSE MEDICATIONS LISTED ON YOUR MEDICATION LIST TO YOU DURING YOUR ADMISSION IN THE HOSPITAL!    Patients discharged on the day of surgery will not be allowed to drive home.  Someone NEEDS to stay with you for the first 24 hours after anesthesia.   Special Instructions: Bring a copy of your healthcare power  of attorney and living will documents the day of surgery if you haven't scanned them before.              Please read over the following fact sheets you were given: IF YOU HAVE QUESTIONS ABOUT YOUR PRE-OP INSTRUCTIONS PLEASE CALL [phone].    If you test positive for Covid or have been in contact with anyone that has tested positive in the last 10 days please notify you surgeon.      Pre-operative 5 CHG Bath Instructions   You can play a key role in reducing the risk of infection after surgery. Your skin needs to be as free of germs as possible. You can reduce the number of germs on your skin by washing with CHG (chlorhexidine gluconate) soap before surgery. CHG is an antiseptic soap that kills germs and continues to kill germs even after washing.   DO NOT use if you have an allergy to chlorhexidine/CHG or antibacterial soaps. If your skin becomes reddened or irritated, stop using the CHG and notify one of our RNs at [phone].   Please shower with the CHG soap starting 4 days before surgery using the following schedule:     Please keep in mind the following:  DO NOT shave, including legs and underarms, starting the day of your first shower.   You may shave your face at any point before/day of surgery.  Place clean sheets on your bed the day you start using  CHG soap. Use a clean washcloth (not used since being washed) for each shower. DO NOT sleep with pets once you start using the CHG.   CHG Shower Instructions:  If you choose to wash your hair and private area, wash first with your normal shampoo/soap.  After you use shampoo/soap, rinse your hair and body thoroughly to remove shampoo/soap residue.  Turn the water OFF and apply about 3 tablespoons (45 ml) of CHG soap to a CLEAN washcloth.  Apply CHG soap ONLY FROM YOUR NECK DOWN TO YOUR TOES (washing for 3-5 minutes)  DO NOT use CHG soap on face, private areas, open wounds, or sores.  Pay special attention to the area where your  surgery is being performed.  If you are having back surgery, having someone wash your back for you may be helpful. Wait 2 minutes after CHG soap is applied, then you may rinse off the CHG soap.  Pat dry with a clean towel  Put on clean clothes/pajamas   If you choose to wear lotion, please use ONLY the CHG-compatible lotions on the back of this paper.     Additional instructions for the day of surgery: DO NOT APPLY any lotions, deodorants, cologne, or perfumes.   Put on clean/comfortable clothes.  Brush your teeth.  Ask your nurse before applying any prescription medications to the skin.      CHG Compatible Lotions   Aveeno Moisturizing lotion  Cetaphil Moisturizing Cream  Cetaphil Moisturizing Lotion  Clairol Herbal Essence Moisturizing Lotion, Dry Skin  Clairol Herbal Essence Moisturizing Lotion, Extra Dry Skin  Clairol Herbal Essence Moisturizing Lotion, Normal Skin  Curel Age Defying Therapeutic Moisturizing Lotion with Alpha Hydroxy  Curel Extreme Care Body Lotion  Curel Soothing Hands Moisturizing Hand Lotion  Curel Therapeutic Moisturizing Cream, Fragrance-Free  Curel Therapeutic Moisturizing Lotion, Fragrance-Free  Curel Therapeutic Moisturizing Lotion, Original Formula  Eucerin Daily Replenishing Lotion  Eucerin Dry Skin Therapy Plus Alpha Hydroxy Crme  Eucerin Dry Skin Therapy Plus Alpha Hydroxy Lotion  Eucerin Original Crme  Eucerin Original Lotion  Eucerin Plus Crme Eucerin Plus Lotion  Eucerin TriLipid Replenishing Lotion  Keri Anti-Bacterial Hand Lotion  Keri Deep Conditioning Original Lotion Dry Skin Formula Softly Scented  Keri Deep Conditioning Original Lotion, Fragrance Free Sensitive Skin Formula  Keri Lotion Fast Absorbing Fragrance Free Sensitive Skin Formula  Keri Lotion Fast Absorbing Softly Scented Dry Skin Formula  Keri Original Lotion  Keri Skin Renewal Lotion Keri Silky Smooth Lotion  Keri Silky Smooth Sensitive Skin Lotion  Nivea Body  Creamy Conditioning Oil  Nivea Body Extra Enriched Teacher, adult education Moisturizing Lotion Nivea Crme  Nivea Skin Firming Lotion  NutraDerm 30 Skin Lotion  NutraDerm Skin Lotion  NutraDerm Therapeutic Skin Cream  NutraDerm Therapeutic Skin Lotion  ProShield Protective Hand Cream  WHAT IS A BLOOD TRANSFUSION? Blood Transfusion Information  A transfusion is the replacement of blood or some of its parts. Blood is made up of multiple cells which provide different functions. Red blood cells carry oxygen and are used for blood loss replacement. White blood cells fight against infection. Platelets control bleeding. Plasma helps clot blood. Other blood products are available for specialized needs, such as hemophilia or other clotting disorders. BEFORE THE TRANSFUSION  Who gives blood for transfusions?  Healthy volunteers who are fully evaluated to make sure their blood is safe. This is blood bank blood. Transfusion therapy is the safest it has ever been in the practice of medicine.  Before blood is taken from a donor, a complete history is taken to make sure that person has no history of diseases nor engages in risky social behavior (examples are intravenous drug use or sexual activity with multiple partners). The donor's travel history is screened to minimize risk of transmitting infections, such as malaria. The donated blood is tested for signs of infectious diseases, such as HIV and hepatitis. The blood is then tested to be sure it is compatible with you in order to minimize the chance of a transfusion reaction. If you or a relative donates blood, this is often done in anticipation of surgery and is not appropriate for emergency situations. It takes many days to process the donated blood. RISKS AND COMPLICATIONS Although transfusion therapy is very safe and saves many lives, the main dangers of transfusion include:  Getting an infectious disease. Developing a  transfusion reaction. This is an allergic reaction to something in the blood you were given. Every precaution is taken to prevent this. The decision to have a blood transfusion has been considered carefully by your caregiver before blood is given. Blood is not given unless the benefits outweigh the risks. AFTER THE TRANSFUSION Right after receiving a blood transfusion, you will usually feel much better and more energetic. This is especially true if your red blood cells have gotten low (anemic). The transfusion raises the level of the red blood cells which carry oxygen, and this usually causes an energy increase. The nurse administering the transfusion will monitor you carefully for complications. HOME CARE INSTRUCTIONS  No special instructions are needed after a transfusion. You may find your energy is better. Speak with your caregiver about any limitations on activity for underlying diseases you may have. SEEK MEDICAL CARE IF:  Your condition is not improving after your transfusion. You develop redness or irritation at the intravenous (IV) site. SEEK IMMEDIATE MEDICAL CARE IF:  Any of the following symptoms occur over the next 12 hours: Shaking chills. You have a temperature by mouth above 102 F (38.9 C), not controlled by medicine. Chest, back, or muscle pain. People around you feel you are not acting correctly or are confused. Shortness of breath or difficulty breathing. Dizziness and fainting. You get a rash or develop hives. You have a decrease in urine output. Your urine turns a dark color or changes to pink, red, or brown. Any of the following symptoms occur over the next 10 days: You have a temperature by mouth above 102 F (38.9 C), not controlled by medicine. Shortness of breath. Weakness after normal activity. The white part of the eye turns yellow (jaundice). You have a decrease in the amount of urine or are urinating less often. Your urine turns a dark color or changes to  pink, red, or brown. Document Released: 10/06/2000 Document Revised: 01/01/2012 Document Reviewed: 05/25/2008 [employer] Patient Information 2014 [place], [place].  _______________________________________________________________________  Incentive Spirometer  An incentive spirometer is a tool that can help keep your lungs clear and active. This tool measures how well you are filling your lungs with each breath. Taking long deep breaths may help reverse or decrease the chance of developing breathing (pulmonary) problems (especially infection) following: A long period of time when you are unable to move or be active. BEFORE THE PROCEDURE  If the spirometer includes an indicator to show your best effort, your nurse or respiratory therapist will set it to a desired goal. If possible, sit up straight or lean slightly forward. Try not to slouch. Hold the  incentive spirometer in an upright position. INSTRUCTIONS FOR USE  Sit on the edge of your bed if possible, or sit up as far as you can in bed or on a chair. Hold the incentive spirometer in an upright position. Breathe out normally. Place the mouthpiece in your mouth and seal your lips tightly around it. Breathe in slowly and as deeply as possible, raising the piston or the ball toward the top of the column. Hold your breath for 3-5 seconds or for as long as possible. Allow the piston or ball to fall to the bottom of the column. Remove the mouthpiece from your mouth and breathe out normally. Rest for a few seconds and repeat Steps 1 through 7 at least 10 times every 1-2 hours when you are awake. Take your time and take a few normal breaths between deep breaths. The spirometer may include an indicator to show your best effort. Use the indicator as a goal to work toward during each repetition. After each set of 10 deep breaths, practice coughing to be sure your lungs are clear. If you have an incision (the cut made at the time of surgery), support your  incision when coughing by placing a pillow or rolled up towels firmly against it. Once you are able to get out of bed, walk around indoors and cough well. You may stop using the incentive spirometer when instructed by your caregiver.  RISKS AND COMPLICATIONS Take your time so you do not get dizzy or light-headed. If you are in pain, you may need to take or ask for pain medication before doing incentive spirometry. It is harder to take a deep breath if you are having pain. AFTER USE Rest and breathe slowly and easily. It can be helpful to keep track of a log of your progress. Your caregiver can provide you with a simple table to help with this. If you are using the spirometer at home, follow these instructions: SEEK MEDICAL CARE IF:  You are having difficultly using the spirometer. You have trouble using the spirometer as often as instructed. Your pain medication is not giving enough relief while using the spirometer. You develop fever of 100.5 F (38.1 C) or higher. SEEK IMMEDIATE MEDICAL CARE IF:  You cough up bloody sputum that had not been present before. You develop fever of 102 F (38.9 C) or greater. You develop worsening pain at or near the incision site. MAKE SURE YOU:  Understand these instructions. Will watch your condition. Will get help right away if you are not doing well or get worse. Document Released: 02/19/2007 Document Revised: 01/01/2012 Document Reviewed: 04/22/2007 [facility] Patient Information 2014 [place], [place].   ________________________________________________________________________

## 2024-06-24 NOTE — Progress Notes (Addendum)
 PCP - Redge Shams, NP clearance media 04-23-24   LOV Erminio Sensing, MD 05-19-24 epic Cardiologist - n/a  PPM/ICD -  Device Orders -  Rep Notified -   Chest x-ray -  EKG - 04-16-24 media Stress Test -  ECHO - 2024 epic Cardiac Cath -   Sleep Study - n/a CPAP -   Fasting Blood Sugar - n/a Checks Blood Sugar __n/a___ times a day  Blood Thinner Instructions: Aspirin Instructions:n/a  ERAS Protcol - PRE-SURGERY Ensure    COVID vaccine -  Activity--Able to climb a flight of stairs with no CP or SOB, has stairs in home Anesthesia review: HTN  Patient denies shortness of breath, fever, cough and chest pain at PAT appointment   All instructions explained to the patient, with a verbal understanding of the material. Patient agrees to go over the instructions while at home for a better understanding. Patient also instructed to self quarantine after being tested for COVID-19. The opportunity to ask questions was provided.

## 2024-06-25 ENCOUNTER — Encounter (HOSPITAL_COMMUNITY)
Admission: RE | Admit: 2024-06-25 | Discharge: 2024-06-25 | Disposition: A | Discharge status: Home / Self Care | Source: Ambulatory Visit | Attending: Orthopaedic Surgery | Admitting: Orthopaedic Surgery

## 2024-06-25 ENCOUNTER — Other Ambulatory Visit: Payer: Self-pay

## 2024-06-25 ENCOUNTER — Encounter (HOSPITAL_COMMUNITY): Payer: Self-pay

## 2024-06-25 VITALS — BP 113/55 | HR 72 | Temp 98.0°F | Resp 16 | Ht 64.0 in | Wt 165.0 lb

## 2024-06-25 DIAGNOSIS — Z01812 Encounter for preprocedural laboratory examination: Secondary | ICD-10-CM | POA: Insufficient documentation

## 2024-06-25 DIAGNOSIS — Z01818 Encounter for other preprocedural examination: Secondary | ICD-10-CM

## 2024-06-25 DIAGNOSIS — M1611 Unilateral primary osteoarthritis, right hip: Secondary | ICD-10-CM | POA: Insufficient documentation

## 2024-06-25 HISTORY — DX: Pure hypercholesterolemia, unspecified: E78.00

## 2024-06-25 HISTORY — DX: Essential (primary) hypertension: I10

## 2024-06-25 HISTORY — DX: Diverticulosis of intestine, part unspecified, without perforation or abscess without bleeding: K57.90

## 2024-06-25 LAB — CBC
HCT: 45.2 % (ref 36.0–46.0)
Hemoglobin: 14.1 g/dL (ref 12.0–15.0)
MCH: 30.1 pg (ref 26.0–34.0)
MCHC: 31.2 g/dL (ref 30.0–36.0)
MCV: 96.4 fL (ref 80.0–100.0)
Platelets: 263 K/uL (ref 150–400)
RBC: 4.69 MIL/uL (ref 3.87–5.11)
RDW: 12.8 % (ref 11.5–15.5)
WBC: 8.4 K/uL (ref 4.0–10.5)
nRBC: 0 % (ref 0.0–0.2)

## 2024-06-25 LAB — SURGICAL PCR SCREEN
MRSA, PCR: NEGATIVE
Staphylococcus aureus: NEGATIVE

## 2024-06-25 LAB — BASIC METABOLIC PANEL WITH GFR
Anion gap: 11 (ref 5–15)
BUN: 26 mg/dL — ABNORMAL HIGH (ref 8–23)
CO2: 26 mmol/L (ref 22–32)
Calcium: 10.2 mg/dL (ref 8.9–10.3)
Chloride: 102 mmol/L (ref 98–111)
Creatinine, Ser: 0.79 mg/dL (ref 0.44–1.00)
GFR, Estimated: >60 mL/min (ref >60)
Glucose, Bld: 95 mg/dL (ref 70–99)
Potassium: 4 mmol/L (ref 3.5–5.1)
Sodium: 140 mmol/L (ref 135–145)

## 2024-07-01 ENCOUNTER — Other Ambulatory Visit: Payer: Self-pay | Admitting: Physician Assistant

## 2024-07-01 MED ORDER — ASPIRIN 81 MG PO CHEW: 81.0000 mg | CHEWABLE_TABLET | Freq: Two times a day (BID) | ORAL | 0 refills | Status: AC

## 2024-07-01 MED ORDER — DOCUSATE SODIUM 100 MG PO CAPS
100.0000 mg | ORAL_CAPSULE | Freq: Every day | ORAL | 2 refills | Status: AC | PRN
Start: 2024-07-01 — End: 2025-07-01

## 2024-07-01 MED ORDER — OXYCODONE-ACETAMINOPHEN 5-325 MG PO TABS: 1.0000 | ORAL_TABLET | Freq: Four times a day (QID) | ORAL | 0 refills | Status: AC | PRN

## 2024-07-01 MED ORDER — METHOCARBAMOL 750 MG PO TABS: 750.0000 mg | ORAL_TABLET | Freq: Three times a day (TID) | ORAL | 2 refills | Status: AC | PRN

## 2024-07-01 MED ORDER — ONDANSETRON HCL 4 MG PO TABS: 4.0000 mg | ORAL_TABLET | Freq: Three times a day (TID) | ORAL | 0 refills | Status: AC | PRN

## 2024-07-02 MED ORDER — TRANEXAMIC ACID 1000 MG/10ML IV SOLN
2000.0000 mg | INTRAVENOUS | Status: DC
  Filled 2024-07-02: qty 20

## 2024-07-03 ENCOUNTER — Ambulatory Visit (HOSPITAL_COMMUNITY): Admitting: Anesthesiology

## 2024-07-03 ENCOUNTER — Ambulatory Visit (HOSPITAL_COMMUNITY)

## 2024-07-03 ENCOUNTER — Observation Stay (HOSPITAL_COMMUNITY)
Admission: RE | Admit: 2024-07-03 | Discharge: 2024-07-04 | Disposition: A | Discharge status: Home / Self Care | Attending: Orthopaedic Surgery | Admitting: Orthopaedic Surgery

## 2024-07-03 ENCOUNTER — Encounter (HOSPITAL_COMMUNITY): Payer: Self-pay | Admitting: Orthopaedic Surgery

## 2024-07-03 ENCOUNTER — Observation Stay (HOSPITAL_COMMUNITY)

## 2024-07-03 ENCOUNTER — Encounter (HOSPITAL_COMMUNITY)
Admission: RE | Disposition: A | Discharge status: Home / Self Care | Payer: Self-pay | Source: Home / Self Care | Attending: Orthopaedic Surgery

## 2024-07-03 ENCOUNTER — Other Ambulatory Visit: Payer: Self-pay

## 2024-07-03 DIAGNOSIS — E039 Hypothyroidism, unspecified: Secondary | ICD-10-CM | POA: Insufficient documentation

## 2024-07-03 DIAGNOSIS — M1611 Unilateral primary osteoarthritis, right hip: Principal | ICD-10-CM | POA: Insufficient documentation

## 2024-07-03 DIAGNOSIS — Z79899 Other long term (current) drug therapy: Secondary | ICD-10-CM | POA: Diagnosis not present

## 2024-07-03 DIAGNOSIS — I10 Essential (primary) hypertension: Secondary | ICD-10-CM | POA: Insufficient documentation

## 2024-07-03 DIAGNOSIS — E785 Hyperlipidemia, unspecified: Secondary | ICD-10-CM | POA: Diagnosis not present

## 2024-07-03 DIAGNOSIS — Z7982 Long term (current) use of aspirin: Secondary | ICD-10-CM | POA: Insufficient documentation

## 2024-07-03 DIAGNOSIS — Z96641 Presence of right artificial hip joint: Secondary | ICD-10-CM

## 2024-07-03 DIAGNOSIS — M25551 Pain in right hip: Secondary | ICD-10-CM | POA: Diagnosis present

## 2024-07-03 HISTORY — PX: TOTAL HIP ARTHROPLASTY: SHX124

## 2024-07-03 LAB — TYPE AND SCREEN
ABO/RH(D): A POS
Antibody Screen: NEGATIVE

## 2024-07-03 LAB — ABO/RH: ABO/RH(D): A POS

## 2024-07-03 SURGERY — ARTHROPLASTY, HIP, TOTAL, ANTERIOR APPROACH
Anesthesia: Spinal | Site: Hip | Laterality: Right

## 2024-07-03 MED ORDER — ISOPROPYL ALCOHOL 70 % SOLN
Status: DC | PRN
  Administered 2024-07-03: 1 via TOPICAL

## 2024-07-03 MED ORDER — DEXAMETHASONE SODIUM PHOSPHATE 10 MG/ML IJ SOLN
INTRAMUSCULAR | Status: AC
  Filled 2024-07-03: qty 1

## 2024-07-03 MED ORDER — SODIUM CHLORIDE 0.9 % IV SOLN
INTRAVENOUS | Status: DC
Start: 2024-07-03 — End: 2024-07-04

## 2024-07-03 MED ORDER — TRANEXAMIC ACID 1000 MG/10ML IV SOLN
INTRAVENOUS | Status: DC | PRN
  Administered 2024-07-03: 2000 mg via TOPICAL

## 2024-07-03 MED ORDER — ACETAMINOPHEN 500 MG PO TABS
1000.0000 mg | ORAL_TABLET | Freq: Once | ORAL | Status: AC
  Administered 2024-07-03: 1000 mg via ORAL
  Filled 2024-07-03: qty 2

## 2024-07-03 MED ORDER — DEXAMETHASONE SODIUM PHOSPHATE 10 MG/ML IJ SOLN
10.0000 mg | Freq: Once | INTRAMUSCULAR | Status: AC
  Administered 2024-07-04: 10 mg via INTRAVENOUS
  Filled 2024-07-03: qty 1

## 2024-07-03 MED ORDER — FENTANYL CITRATE (PF) 100 MCG/2ML IJ SOLN
INTRAMUSCULAR | Status: AC
  Filled 2024-07-03: qty 2

## 2024-07-03 MED ORDER — PHENYLEPHRINE HCL-NACL 20-0.9 MG/250ML-% IV SOLN
INTRAVENOUS | Status: AC
  Filled 2024-07-03: qty 500

## 2024-07-03 MED ORDER — MENTHOL 3 MG MT LOZG: 1.0000 | LOZENGE | OROMUCOSAL | Status: DC | PRN

## 2024-07-03 MED ORDER — LOSARTAN POTASSIUM 50 MG PO TABS
100.0000 mg | ORAL_TABLET | Freq: Every day | ORAL | Status: DC
  Administered 2024-07-04: 100 mg via ORAL
  Filled 2024-07-03: qty 2

## 2024-07-03 MED ORDER — ASPIRIN 81 MG PO CHEW
81.0000 mg | CHEWABLE_TABLET | Freq: Two times a day (BID) | ORAL | Status: DC
  Administered 2024-07-03 – 2024-07-04 (×2): 81 mg via ORAL
  Filled 2024-07-03 (×2): qty 1

## 2024-07-03 MED ORDER — METHOCARBAMOL 1000 MG/10ML IJ SOLN
500.0000 mg | Freq: Four times a day (QID) | INTRAMUSCULAR | Status: DC | PRN
  Administered 2024-07-03: 500 mg via INTRAVENOUS

## 2024-07-03 MED ORDER — HYDROCODONE-ACETAMINOPHEN 7.5-325 MG PO TABS: 1.0000 | ORAL_TABLET | Freq: Three times a day (TID) | ORAL | Status: DC | PRN

## 2024-07-03 MED ORDER — MAGNESIUM CITRATE PO SOLN: 1.0000 | Freq: Once | ORAL | Status: DC | PRN

## 2024-07-03 MED ORDER — MORPHINE SULFATE (PF) 2 MG/ML IV SOLN: 0.5000 mg | Freq: Four times a day (QID) | INTRAVENOUS | Status: DC | PRN

## 2024-07-03 MED ORDER — HYDROCODONE-ACETAMINOPHEN 5-325 MG PO TABS
1.0000 | ORAL_TABLET | Freq: Three times a day (TID) | ORAL | Status: DC | PRN
  Administered 2024-07-03 – 2024-07-04 (×4): 1 via ORAL
  Filled 2024-07-03 (×4): qty 1

## 2024-07-03 MED ORDER — CEFAZOLIN SODIUM-DEXTROSE 2-4 GM/100ML-% IV SOLN
2.0000 g | Freq: Four times a day (QID) | INTRAVENOUS | Status: AC
  Administered 2024-07-03 (×2): 2 g via INTRAVENOUS
  Filled 2024-07-03 (×2): qty 100

## 2024-07-03 MED ORDER — TRANEXAMIC ACID-NACL 1000-0.7 MG/100ML-% IV SOLN
1000.0000 mg | Freq: Once | INTRAVENOUS | Status: AC
  Administered 2024-07-03: 1000 mg via INTRAVENOUS
  Filled 2024-07-03: qty 100

## 2024-07-03 MED ORDER — ONDANSETRON HCL 4 MG/2ML IJ SOLN: 4.0000 mg | Freq: Four times a day (QID) | INTRAMUSCULAR | Status: DC | PRN

## 2024-07-03 MED ORDER — DIPHENHYDRAMINE HCL 12.5 MG/5ML PO ELIX: 25.0000 mg | ORAL_SOLUTION | ORAL | Status: DC | PRN

## 2024-07-03 MED ORDER — VANCOMYCIN HCL 1 G IV SOLR
INTRAVENOUS | Status: DC | PRN
  Administered 2024-07-03: 1000 mg via TOPICAL

## 2024-07-03 MED ORDER — CEFAZOLIN SODIUM-DEXTROSE 2-4 GM/100ML-% IV SOLN
2.0000 g | INTRAVENOUS | Status: AC
  Administered 2024-07-03: 2 g via INTRAVENOUS
  Filled 2024-07-03: qty 100

## 2024-07-03 MED ORDER — PHENYLEPHRINE HCL-NACL 20-0.9 MG/250ML-% IV SOLN
INTRAVENOUS | Status: DC | PRN
  Administered 2024-07-03: 30 ug/min via INTRAVENOUS

## 2024-07-03 MED ORDER — FENTANYL CITRATE (PF) 100 MCG/2ML IJ SOLN
INTRAMUSCULAR | Status: DC | PRN
  Administered 2024-07-03 (×2): 25 ug via INTRAVENOUS
  Administered 2024-07-03: 50 ug via INTRAVENOUS

## 2024-07-03 MED ORDER — METHOCARBAMOL 500 MG PO TABS
500.0000 mg | ORAL_TABLET | Freq: Four times a day (QID) | ORAL | Status: DC | PRN
  Administered 2024-07-03: 500 mg via ORAL
  Filled 2024-07-03: qty 1

## 2024-07-03 MED ORDER — VANCOMYCIN HCL 1000 MG IV SOLR
INTRAVENOUS | Status: AC
  Filled 2024-07-03: qty 20

## 2024-07-03 MED ORDER — STERILE WATER FOR IRRIGATION IR SOLN
Status: DC | PRN
  Administered 2024-07-03: 2000 mL

## 2024-07-03 MED ORDER — ORAL CARE MOUTH RINSE: 15.0000 mL | Freq: Once | OROMUCOSAL | Status: AC

## 2024-07-03 MED ORDER — POVIDONE-IODINE 10 % EX SWAB
2.0000 | Freq: Once | CUTANEOUS | Status: AC
  Administered 2024-07-03: 2 via TOPICAL

## 2024-07-03 MED ORDER — HYDROCHLOROTHIAZIDE 12.5 MG PO TABS
12.5000 mg | ORAL_TABLET | Freq: Every day | ORAL | Status: DC
  Administered 2024-07-04: 12.5 mg via ORAL
  Filled 2024-07-03: qty 1

## 2024-07-03 MED ORDER — TRANEXAMIC ACID-NACL 1000-0.7 MG/100ML-% IV SOLN
1000.0000 mg | INTRAVENOUS | Status: AC
  Administered 2024-07-03: 1000 mg via INTRAVENOUS
  Filled 2024-07-03: qty 100

## 2024-07-03 MED ORDER — ONDANSETRON HCL 4 MG/2ML IJ SOLN
INTRAMUSCULAR | Status: AC
  Filled 2024-07-03: qty 2

## 2024-07-03 MED ORDER — METOCLOPRAMIDE HCL 5 MG PO TABS: 5.0000 mg | ORAL_TABLET | Freq: Three times a day (TID) | ORAL | Status: DC | PRN

## 2024-07-03 MED ORDER — BUPIVACAINE-MELOXICAM ER 200-6 MG/7ML IJ SOLN
INTRAMUSCULAR | Status: DC | PRN
  Administered 2024-07-03: 6.7 mL

## 2024-07-03 MED ORDER — LACTATED RINGERS IV SOLN: INTRAVENOUS | Status: DC

## 2024-07-03 MED ORDER — DOCUSATE SODIUM 100 MG PO CAPS
100.0000 mg | ORAL_CAPSULE | Freq: Two times a day (BID) | ORAL | Status: DC
  Administered 2024-07-03 – 2024-07-04 (×2): 100 mg via ORAL
  Filled 2024-07-03 (×2): qty 1

## 2024-07-03 MED ORDER — ACETAMINOPHEN 325 MG PO TABS: 325.0000 mg | ORAL_TABLET | Freq: Four times a day (QID) | ORAL | Status: DC | PRN

## 2024-07-03 MED ORDER — BUPIVACAINE-MELOXICAM ER 200-6 MG/7ML IJ SOLN
INTRAMUSCULAR | Status: AC
Start: 2024-07-03 — End: 2024-07-03
  Filled 2024-07-03: qty 1

## 2024-07-03 MED ORDER — PANTOPRAZOLE SODIUM 40 MG PO TBEC
40.0000 mg | DELAYED_RELEASE_TABLET | Freq: Every day | ORAL | Status: DC
  Administered 2024-07-03 – 2024-07-04 (×2): 40 mg via ORAL
  Filled 2024-07-03 (×2): qty 1

## 2024-07-03 MED ORDER — ONDANSETRON HCL 4 MG/2ML IJ SOLN
INTRAMUSCULAR | Status: DC | PRN
  Administered 2024-07-03: 4 mg via INTRAVENOUS

## 2024-07-03 MED ORDER — ACETAMINOPHEN 500 MG PO TABS
1000.0000 mg | ORAL_TABLET | Freq: Four times a day (QID) | ORAL | Status: AC
  Administered 2024-07-03 – 2024-07-04 (×3): 1000 mg via ORAL
  Filled 2024-07-03 (×3): qty 2

## 2024-07-03 MED ORDER — SORBITOL 70 % SOLN: 30.0000 mL | Freq: Every day | Status: DC | PRN

## 2024-07-03 MED ORDER — ONDANSETRON HCL 4 MG PO TABS: 4.0000 mg | ORAL_TABLET | Freq: Four times a day (QID) | ORAL | Status: DC | PRN

## 2024-07-03 MED ORDER — PROPOFOL 500 MG/50ML IV EMUL
INTRAVENOUS | Status: DC | PRN
  Administered 2024-07-03: 50 ug/kg/min via INTRAVENOUS

## 2024-07-03 MED ORDER — POLYETHYLENE GLYCOL 3350 17 G PO PACK
17.0000 g | PACK | Freq: Every day | ORAL | Status: DC
  Administered 2024-07-04: 17 g via ORAL
  Filled 2024-07-03: qty 1

## 2024-07-03 MED ORDER — METOCLOPRAMIDE HCL 5 MG/ML IJ SOLN: 5.0000 mg | Freq: Three times a day (TID) | INTRAMUSCULAR | Status: DC | PRN

## 2024-07-03 MED ORDER — DROPERIDOL 2.5 MG/ML IJ SOLN: 0.6250 mg | Freq: Once | INTRAMUSCULAR | Status: DC | PRN

## 2024-07-03 MED ORDER — PHENOL 1.4 % MT LIQD: 1.0000 | OROMUCOSAL | Status: DC | PRN

## 2024-07-03 MED ORDER — 0.9 % SODIUM CHLORIDE (POUR BTL) OPTIME
TOPICAL | Status: DC | PRN
  Administered 2024-07-03: 1000 mL

## 2024-07-03 MED ORDER — METHOCARBAMOL 1000 MG/10ML IJ SOLN
INTRAMUSCULAR | Status: AC
Start: 2024-07-03 — End: 2024-07-03
  Filled 2024-07-03: qty 10

## 2024-07-03 MED ORDER — LIDOCAINE HCL (PF) 2 % IJ SOLN
INTRAMUSCULAR | Status: AC
  Filled 2024-07-03: qty 5

## 2024-07-03 MED ORDER — LEVOTHYROXINE SODIUM 100 MCG PO TABS
100.0000 ug | ORAL_TABLET | Freq: Every day | ORAL | Status: DC
  Administered 2024-07-04: 100 ug via ORAL
  Filled 2024-07-03: qty 1

## 2024-07-03 MED ORDER — FENTANYL CITRATE PF 50 MCG/ML IJ SOSY: 25.0000 ug | PREFILLED_SYRINGE | INTRAMUSCULAR | Status: DC | PRN

## 2024-07-03 MED ORDER — ALUM & MAG HYDROXIDE-SIMETH 200-200-20 MG/5ML PO SUSP: 30.0000 mL | ORAL | Status: DC | PRN

## 2024-07-03 MED ORDER — LOSARTAN POTASSIUM-HCTZ 100-12.5 MG PO TABS: 1.0000 | ORAL_TABLET | Freq: Every day | ORAL | Status: DC

## 2024-07-03 MED ORDER — PROPOFOL 10 MG/ML IV BOLUS
INTRAVENOUS | Status: AC
Start: 2024-07-03 — End: 2024-07-03
  Filled 2024-07-03: qty 20

## 2024-07-03 MED ORDER — DEXAMETHASONE SODIUM PHOSPHATE 10 MG/ML IJ SOLN
INTRAMUSCULAR | Status: DC | PRN
Start: 2024-07-03 — End: 2024-07-03
  Administered 2024-07-03: 10 mg via INTRAVENOUS

## 2024-07-03 MED ORDER — CHLORHEXIDINE GLUCONATE 0.12 % MT SOLN
15.0000 mL | Freq: Once | OROMUCOSAL | Status: AC
  Administered 2024-07-03: 15 mL via OROMUCOSAL

## 2024-07-03 MED ORDER — SODIUM CHLORIDE 0.9 % IR SOLN
Status: DC | PRN
  Administered 2024-07-03: 1000 mL

## 2024-07-03 MED ORDER — ISOPROPYL ALCOHOL 70 % SOLN
Status: AC
Start: 2024-07-03 — End: 2024-07-03
  Filled 2024-07-03: qty 480

## 2024-07-03 SURGICAL SUPPLY — 45 items
BAG COUNTER SPONGE SURGICOUNT (BAG) IMPLANT
BAG ZIPLOCK 12X15 (MISCELLANEOUS) ×1 IMPLANT
BALL HIP ARTICU EZE 36 8.5 (Hips) IMPLANT
BLADE SAG 18X100X1.27 (BLADE) ×1 IMPLANT
CLSR STERI-STRIP ANTIMIC 1/2X4 (GAUZE/BANDAGES/DRESSINGS) IMPLANT
COVER PERINEAL POST (MISCELLANEOUS) ×1 IMPLANT
COVER SURGICAL LIGHT HANDLE (MISCELLANEOUS) ×1 IMPLANT
CUP ACETAB W/GRIPTION 54 (Plate) IMPLANT
DERMABOND ADVANCED .7 DNX12 (GAUZE/BANDAGES/DRESSINGS) IMPLANT
DRAPE IMP U-DRAPE 54X76 (DRAPES) ×1 IMPLANT
DRAPE POUCH INSTRU U-SHP 10X18 (DRAPES) ×1 IMPLANT
DRAPE STERI IOBAN 125X83 (DRAPES) ×1 IMPLANT
DRAPE TOP 10253 STERILE (DRAPES) ×2 IMPLANT
DRAPE U-SHAPE 47X51 STRL (DRAPES) ×1 IMPLANT
DRESSING AQUACEL AG SP 3.5X6 (GAUZE/BANDAGES/DRESSINGS) ×1 IMPLANT
DRSG AQUACEL AG ADV 3.5X10 (GAUZE/BANDAGES/DRESSINGS) ×1 IMPLANT
DURAPREP 26ML APPLICATOR (WOUND CARE) ×2 IMPLANT
ELECT PENCIL ROCKER SW 15FT (MISCELLANEOUS) ×1 IMPLANT
ELECT REM PT RETURN 15FT ADLT (MISCELLANEOUS) ×1 IMPLANT
GLOVE BIOGEL PI IND STRL 7.0 (GLOVE) ×1 IMPLANT
GLOVE BIOGEL PI IND STRL 7.5 (GLOVE) ×1 IMPLANT
GLOVE ECLIPSE 7.0 STRL STRAW (GLOVE) ×3 IMPLANT
GLOVE SURG SYN 7.5 PF PI (GLOVE) ×2 IMPLANT
GOWN SRG XL LVL 4 BRTHBL STRL (GOWNS) ×2 IMPLANT
HANDLE YANKAUER SUCT OPEN TIP (MISCELLANEOUS) IMPLANT
HOOD PEEL AWAY T7 (MISCELLANEOUS) ×3 IMPLANT
KIT TURNOVER KIT A (KITS) ×1 IMPLANT
LINER NEUTRAL 54X36MM PLUS 4 (Hips) IMPLANT
MARKER SKIN DUAL TIP RULER LAB (MISCELLANEOUS) ×1 IMPLANT
NDL SPNL 18GX3.5 QUINCKE PK (NEEDLE) ×1 IMPLANT
NEEDLE SPNL 18GX3.5 QUINCKE PK (NEEDLE) ×1 IMPLANT
PACK ANTERIOR HIP CUSTOM (KITS) ×1 IMPLANT
SCREW 6.5MMX30MM (Screw) IMPLANT
SET HNDPC FAN SPRY TIP SCT (DISPOSABLE) ×1 IMPLANT
SOLUTION PRONTOSAN WOUND 350ML (IRRIGATION / IRRIGATOR) ×1 IMPLANT
STEM FEM ACTIS HIGH SZ7 (Stem) IMPLANT
SUT ETHIBOND 2 V 37 (SUTURE) ×1 IMPLANT
SUT ETHILON 2 0 PS N (SUTURE) IMPLANT
SUT MNCRL AB 3-0 PS2 18 (SUTURE) IMPLANT
SUT NYLON 3 0 (SUTURE) IMPLANT
SUT STRATAFIX PDS+ 0 24IN (SUTURE) ×1 IMPLANT
SUT VIC AB 0 CT1 36 (SUTURE) IMPLANT
SUT VIC AB 2-0 CT1 TAPERPNT 27 (SUTURE) ×2 IMPLANT
TRAY CATH INTERMITTENT SS 16FR (CATHETERS) IMPLANT
TUBE SUCTION HIGH CAP CLEAR NV (SUCTIONS) ×1 IMPLANT

## 2024-07-03 NOTE — Evaluation (Signed)
 Physical Therapy Evaluation Patient Details Name: Dana Duffy MRN: [id number] DOB: 09-10-1946 Today's Date: 07/03/2024  History of Present Illness  Pt s/p R THR  Clinical Impression  Pt s/p R THR and presents with decreased R LE strength/ROM and post op pain limiting functional mobility.  Pt motivated and should progress to dc home with family assist.  Pt reports no insight into follow up PT services.        If plan is discharge home, recommend the following: A little help with walking and/or transfers;A little help with bathing/dressing/bathroom;Assistance with cooking/housework;Assist for transportation;Help with stairs or ramp for entrance   Can travel by private vehicle        Equipment Recommendations Rolling walker (2 wheels)  Recommendations for Other Services       Functional Status Assessment Patient has had a recent decline in their functional status and demonstrates the ability to make significant improvements in function in a reasonable and predictable amount of time.     Precautions / Restrictions Precautions Precautions: Fall Restrictions Weight Bearing Restrictions Per Provider Order: Yes      Mobility  Bed Mobility Overal bed mobility: Needs Assistance Bed Mobility: Supine to Sit     Supine to sit: Min assist     General bed mobility comments: cues for use of L LE to self assist    Transfers Overall transfer level: Needs assistance Equipment used: Rolling walker (2 wheels) Transfers: Sit to/from Stand Sit to Stand: Min assist           General transfer comment: Cues for LE management and use of UEs to self assist    Ambulation/Gait Ambulation/Gait assistance: Min assist Gait Distance (Feet): 18 Feet Assistive device: Rolling walker (2 wheels) Gait Pattern/deviations: Step-to pattern, Decreased step length - right, Decreased step length - left, Shuffle, Trunk flexed Gait velocity: decr     General Gait Details: cues for sequence,  posture and position from [employer]            Wheelchair Mobility     Tilt Bed    Modified Rankin (Stroke Patients Only)       Balance Overall balance assessment: Needs assistance Sitting-balance support: No upper extremity supported, Feet supported Sitting balance-Leahy Scale: Good     Standing balance support: Bilateral upper extremity supported Standing balance-Leahy Scale: Poor                               Pertinent Vitals/Pain Pain Assessment Pain Assessment: 0-10 Pain Score: 5  Pain Location: R hip Pain Descriptors / Indicators: Aching, Sore, Grimacing Pain Intervention(s): Limited activity within patient's tolerance, Monitored during session, Premedicated before session, Ice applied    Home Living Family/patient expects to be discharged to:: Private residence Living Arrangements: Children Available Help at Discharge: Family;Available 24 hours/day Type of Home: House Home Access: Stairs to enter Entrance Stairs-Rails: None Entrance Stairs-Number of Steps: 3 Alternate Level Stairs-Number of Steps: flight Home Layout: Two level;Bed/bath upstairs Home Equipment: Standard Walker;Cane - single point      Prior Function Prior Level of Function : Independent/Modified Independent             Mobility Comments: using SW for several weeks prior to admit       Extremity/Trunk Assessment   Upper Extremity Assessment Upper Extremity Assessment: Overall WFL for tasks assessed    Lower Extremity Assessment Lower Extremity Assessment: RLE deficits/detail    Cervical / Trunk  Assessment Cervical / Trunk Assessment: Normal  Communication   Communication Communication: No apparent difficulties    Cognition Arousal: Alert Behavior During Therapy: WFL for tasks assessed/performed   PT - Cognitive impairments: No apparent impairments                         Following commands: Intact       Cueing       General  Comments      Exercises Total Joint Exercises Ankle Circles/Pumps: AROM, Both, 15 reps, Supine   Assessment/Plan    PT Assessment Patient needs continued PT services  PT Problem List Decreased strength;Decreased range of motion;Decreased activity tolerance;Decreased balance;Decreased mobility;Decreased knowledge of use of DME;Pain       PT Treatment Interventions DME instruction;Gait training;Stair training;Functional mobility training;Therapeutic exercise;Therapeutic activities;Patient/family education    PT Goals (Current goals can be found in the Care Plan section)  Acute Rehab PT Goals Patient Stated Goal: REgain IND PT Goal Formulation: With patient Time For Goal Achievement: 07/10/24 Potential to Achieve Goals: Good    Frequency 7X/week     Co-evaluation               AM-PAC PT 6 Clicks Mobility  Outcome Measure Help needed turning from your back to your side while in a flat bed without using bedrails?: A Little Help needed moving from lying on your back to sitting on the side of a flat bed without using bedrails?: A Little Help needed moving to and from a bed to a chair (including a wheelchair)?: A Little Help needed standing up from a chair using your arms (e.g., wheelchair or bedside chair)?: A Little Help needed to walk in hospital room?: A Lot Help needed climbing 3-5 steps with a railing? : A Lot 6 Click Score: 16    End of Session Equipment Utilized During Treatment: Gait belt Activity Tolerance: Patient tolerated treatment well Patient left: in chair;with call bell/phone within reach;with chair alarm set;with family/visitor present Nurse Communication: Mobility status PT Visit Diagnosis: Unsteadiness on feet (R26.81);Difficulty in walking, not elsewhere classified (R26.2)    Time: 8674-8651 PT Time Calculation (min) (ACUTE ONLY): 23 min   Charges:   PT Evaluation $PT Eval Low Complexity: 1 Low   PT General Charges $$ ACUTE PT VISIT: 1  Visit         [facility] PT Acute Rehabilitation Services Office [phone]   Eliannah Hinde 07/03/2024, 3:27 PM

## 2024-07-03 NOTE — Plan of Care (Signed)
  Problem: Clinical Measurements: Goal: Ability to maintain clinical measurements within normal limits will improve Outcome: Progressing   Problem: Elimination: Goal: Will not experience complications related to bowel motility Outcome: Progressing   Problem: Pain Managment: Goal: General experience of comfort will improve and/or be controlled Outcome: Progressing   Problem: Safety: Goal: Ability to remain free from injury will improve Outcome: Progressing   Problem: Skin Integrity: Goal: Risk for impaired skin integrity will decrease Outcome: Progressing

## 2024-07-03 NOTE — H&P (Signed)
 PREOPERATIVE H&P  Chief Complaint: right hip osteoarthritis  HPI: Dana Duffy is a 78 y.o. female who presents for surgical treatment of right hip osteoarthritis.  She denies any changes in medical history.  Past Surgical History:  Procedure Laterality Date  . BUNIONECTOMY    . CHOLECYSTECTOMY N/A 12/06/2015   Procedure: LAPAROSCOPIC CHOLECYSTECTOMY;  Surgeon: Donnice Bury, MD;  Location: [facility];  Service: General;  Laterality: N/A;  . DIAGNOSTIC LAPAROSCOPY     when had toxic shock syndrome  . LAPAROSCOPIC APPENDECTOMY N/A 06/12/2017   Procedure: APPENDECTOMY LAPAROSCOPIC;  Surgeon: Ethyl Lenis, MD;  Location: [facility];  Service: General;  Laterality: N/A;   Social History   Socioeconomic History  . Marital status: Divorced    Spouse name: Not on file  . Number of children: Not on file  . Years of education: Not on file  . Highest education level: Not on file  Occupational History  . Occupation: Artist  Tobacco Use  . Smoking status: Never  . Smokeless tobacco: Never  Vaping Use  . Vaping status: Never Used  Substance and Sexual Activity  . Alcohol  use: Yes    Alcohol /week: 2.0 standard drinks of alcohol     Types: 2 Glasses of wine per week    Comment: very little  . Drug use: No  . Sexual activity: Not Currently    Comment: 1st intercourse 78 yo-Fewer than 5 partners  Other Topics Concern  . Not on file  Social History Narrative   5 pregnancies   2 living children   2 miscarriages   Divorced.         Social Drivers of Corporate investment banker Strain: Low Risk  (05/19/2024)   Received from [facility]   Overall Financial Resource Strain (CARDIA)   . How hard is it for you to pay for the very basics like food, housing, medical care, and heating?: Not very hard  Food Insecurity: No Food Insecurity (05/19/2024)   Received from [facility]   Hunger Vital Sign   . Within the past 12 months, you worried that your food would  run out before you got the money to buy more.: Never true   . Within the past 12 months, the food you bought just didn't last and you didn't have money to get more.: Never true  Transportation Needs: No Transportation Needs (05/19/2024)   Received from [facility] - Transportation   . In the past 12 months, has lack of transportation kept you from medical appointments or from getting medications?: No   . In the past 12 months, has lack of transportation kept you from meetings, work, or from getting things needed for daily living?: No  Physical Activity: Inactive (05/19/2024)   Received from [facility]   Exercise Vital Sign   . On average, how many days per week do you engage in moderate to strenuous exercise (like a brisk walk)?: 0 days   . Minutes of Exercise per Session: Not on file  Stress: Stress Concern Present (05/19/2024)   Received from [facility] - Occupational Stress Questionnaire   . Do you feel stress - tense, restless, nervous, or anxious, or unable to sleep at night because your mind is troubled all the time - these days?: To some extent  Social Connections: Moderately Integrated (05/19/2024)   Received from [facility]   Social Network   . How would you rate  your social network (family, work, friends)?: Adequate participation with social networks   Family History  Problem Relation Age of Onset  . Heart disease Mother   . Hyperlipidemia Mother   . Hypertension Mother   . Asthma Son   . Hyperlipidemia Maternal Grandmother   . Diabetes Father   . Breast cancer Sister 20   Allergies  Allergen Reactions  . Gatifloxacin Other (See Comments)  . Gluten Meal Other (See Comments)    Avoids gluten  . Sulfa Antibiotics Other (See Comments) and Rash    SKIN REDNESS   Prior to Admission medications   Medication Sig Start Date End Date Taking? Authorizing Provider  aspirin  (ASPIRIN  81) 81 MG chewable tablet Chew 1  tablet (81 mg total) by mouth 2 (two) times daily. To be taken after surgery to prevent blood clots 07/01/24   Jule Ronal CROME, PA-C  docusate sodium  (COLACE) 100 MG capsule Take 1 capsule (100 mg total) by mouth daily as needed. 07/01/24 07/01/25 Yes Jule Ronal CROME, PA-C  FIBER PO Take 1 tablet by mouth every other day.   Yes [provider]  levothyroxine  (SYNTHROID , LEVOTHROID) 100 MCG tablet TAKE 1 TABLET ONCE DAILY. 03/30/16  Yes Joylene Lamar SQUIBB, MD  losartan -hydrochlorothiazide  (HYZAAR) 100-12.5 MG tablet Take 1 tablet by mouth daily. 04/22/17  Yes [provider]  methocarbamol  (ROBAXIN ) 750 MG tablet Take 1 tablet (750 mg total) by mouth 3 (three) times daily as needed. 07/01/24  Yes Jule Ronal CROME, PA-C  Multiple Vitamins-Minerals (MULTIVITAMIN WITH MINERALS) tablet Take 1 tablet by mouth at bedtime.   Yes [provider]  ondansetron  (ZOFRAN ) 4 MG tablet Take 1 tablet (4 mg total) by mouth every 8 (eight) hours as needed for nausea or vomiting. 07/01/24   Jule Ronal CROME, PA-C  oxyCODONE -acetaminophen  (PERCOCET) 5-325 MG tablet Take 1-2 tablets by mouth every 6 (six) hours as needed. 07/01/24   Jule Ronal CROME, PA-C  simvastatin  (ZOCOR ) 20 MG tablet TAKE ONE TABLET AT BEDTIME. 03/30/16  Yes Joylene Lamar SQUIBB, MD     Positive ROS: All other systems have been reviewed and were otherwise negative with the exception of those mentioned in the HPI and as above.  Physical Exam: General: Alert, no acute distress Cardiovascular: No pedal edema Respiratory: No cyanosis, no use of accessory musculature GI: abdomen soft Skin: No lesions in the area of chief complaint Neurologic: Sensation intact distally Psychiatric: Patient is competent for consent with normal mood and affect Lymphatic: no lymphedema  MUSCULOSKELETAL: exam stable  Assessment: right hip osteoarthritis  Plan: Plan for Procedure(s): ARTHROPLASTY, HIP, TOTAL, ANTERIOR APPROACH  The risks benefits  and alternatives were discussed with the patient including but not limited to the risks of nonoperative treatment, versus surgical intervention including infection, bleeding, nerve injury,  blood clots, cardiopulmonary complications, morbidity, mortality, among others, and they were willing to proceed.   Ozell Cummins, MD 07/03/2024 6:14 AM

## 2024-07-03 NOTE — Discharge Instructions (Signed)

## 2024-07-03 NOTE — Anesthesia Procedure Notes (Signed)
 Spinal  Patient location during procedure: OR Start time: 07/03/2024 7:36 AM End time: 07/03/2024 7:46 AM Reason for block: surgical anesthesia Staffing Performed: anesthesiologist  Anesthesiologist: Darlyn Rush, MD Performed by: Darlyn Rush, MD Authorized by: Darlyn Rush, MD   Preanesthetic Checklist Completed: patient identified, IV checked, site marked, risks and benefits discussed, surgical consent, monitors and equipment checked, pre-op evaluation and timeout performed Spinal Block Patient position: sitting Prep: DuraPrep and site prepped and draped Patient monitoring: heart rate, continuous pulse ox and blood pressure Approach: right paramedian Location: L2-3 Injection technique: single-shot Needle Needle type: Spinocan  Needle gauge: 25 G Needle length: 9 cm Additional Notes Expiration date of kit checked and confirmed. Patient tolerated procedure well, without complications.

## 2024-07-03 NOTE — Anesthesia Postprocedure Evaluation (Signed)
 Anesthesia Post Note  Patient: Phyllis Abelson  Procedure(s) Performed: ARTHROPLASTY, HIP, TOTAL, ANTERIOR APPROACH (Right: Hip)     Patient location during evaluation: PACU Anesthesia Type: Spinal Level of consciousness: sedated and patient cooperative Pain management: pain level controlled Vital Signs Assessment: post-procedure vital signs reviewed and stable Respiratory status: spontaneous breathing Cardiovascular status: stable Anesthetic complications: no   No notable events documented.  Last Vitals:  Vitals:   07/03/24 1030 07/03/24 1045  BP: (!) 125/57   Pulse: (!) 47   Resp: 11   Temp:  (!) 35.4 C  SpO2: 99%     Last Pain:  Vitals:   07/03/24 1030  TempSrc:   PainSc: 2                  Norleen Pope

## 2024-07-03 NOTE — Transfer of Care (Signed)
 Immediate Anesthesia Transfer of Care Note  Patient: Dana Duffy  Procedure(s) Performed: ARTHROPLASTY, HIP, TOTAL, ANTERIOR APPROACH (Right: Hip)  Patient Location: PACU  Anesthesia Type:MAC  Level of Consciousness: drowsy  Airway & Oxygen Therapy: Patient Spontanous Breathing  Post-op Assessment: Report given to RN  Post vital signs: Reviewed and stable  Last Vitals:  Vitals Value Taken Time  BP 101/49 07/03/24 09:30  Temp    Pulse 59 07/03/24 09:33  Resp 16 07/03/24 09:33  SpO2 96 % 07/03/24 09:33  Vitals shown include unfiled device data.  Last Pain:  Vitals:   07/03/24 0605  TempSrc: Oral  PainSc:          Complications: No notable events documented.

## 2024-07-03 NOTE — Op Note (Signed)
 ARTHROPLASTY, HIP, TOTAL, ANTERIOR APPROACH  Procedure Note Dana Duffy   [id number]  Pre-op Diagnosis: right hip osteoarthritis     Post-op Diagnosis: same  Operative Findings Severe OA Leg length discrepancy   Operative Procedures  1. Total hip replacement; Right hip; uncemented [id number]   Surgeon: Kay Cummins, M.D.  Assist: Ronal Morna Grave, PA-C   Anesthesia: spinal  Prosthesis: Depuy Acetabulum: Pinnacle 54 mm Femur: Actis 7 HO Head: 36 mm size: +8.5 Liner: +4 neutral Bearing Type: metal/poly  Total Hip Arthroplasty (Anterior Approach) Op Note:  After informed consent was obtained and the operative extremity marked in the holding area, the patient was brought back to the operating room and placed supine on the HANA table. Next, the operative extremity was prepped and draped in normal sterile fashion. Surgical timeout occurred verifying patient identification, surgical site, surgical procedure and administration of antibiotics.  A 10 cm longitudinal incision was made starting from 2 fingerbreadths lateral and inferior to the ASIS towards the lateral aspect of the patella.  A Hueter approach to the hip was performed, using the interval between tensor fascia lata and sartorius.  Dissection was carried bluntly down onto the anterior hip capsule. The lateral femoral circumflex vessels were identified and coagulated. A capsulotomy was performed and the capsular flaps tagged for later repair.  The neck osteotomy was performed above the lesser trochanter. The femoral head was removed which showed severe OA, the acetabular rim was cleared of soft tissue and osteophytes and attention was turned to reaming the acetabulum.  Sequential reaming was performed under fluoroscopic guidance down to the floor of the cotyloid fossa. We reamed to a size 53 mm, and then impacted the acetabular shell. A 30 mm cancellous screw was placed to secure the shell.  A +4 neutral liner was then placed  after irrigation and attention turned to the femur.  After placing the femoral hook, the leg was taken to externally rotated, extended and adducted position taking care to perform soft tissue releases to allow for adequate mobilization of the femur. Soft tissue was cleared from the shoulder of the greater trochanter and the hook elevator used to improve exposure of the proximal femur.  Lateral bone from the shoulder was rasped away for relief.  Sequential broaching performed up to a size 7.  Initially a standard trial neck and +5 head were placed. The leg was brought back up to neutral and the construct reduced.  The position and sizing of components, offset and leg lengths were checked using fluoroscopy.  Based on fluoroscopic findings, we chose to retrial with high offset neck and +8.5 head ball.  This best restored offset and leg length.  Head center to lesser trochanter distance checked.  Stability of the construct was checked in 45 degrees of hip extension and 90 degrees of external rotation without any subluxation, shuck or impingement of prosthesis. We dislocated the prosthesis, dropped the leg back into position, removed trial components, and irrigated copiously. The final stem and head were chosen then placed, the leg brought back up, the system reduced and fluoroscopy used to verify positioning.  Antibiotic irrigation was placed in the surgical wound.   We irrigated, obtained hemostasis and closed the capsule using #2 ethibond suture.  A topical mixture of 0.25% bupivacaine  and meloxicam  was placed deep to the fascia.  One gram of vancomycin  powder was placed in the surgical bed.   One gram of topical tranexamic acid  was injected into the joint.  The fascia was closed  with #1 stratafix, the deep fat layer was closed with 0 vicryl, the subcutaneous layers closed with 2.0 Vicryl Plus and the skin closed with 2.0 nylon and dermabond. A sterile dressing was applied. The patient was awakened in the operating  room and taken to recovery in stable condition.  All sponge, needle, and instrument counts were correct at the end of the case.   Morna Grave, my PA, was a medical necessity for opening, closing, limb positioning, retracting, exposing, and overall facilitation and timely completion of the surgery.  Position: supine  Complications: see description of procedure.  Time Out: performed   Drains/Packing: none  Estimated blood loss: see anesthesia record  Returned to Recovery Room: in good condition.   Antibiotics: yes   Mechanical VTE (DVT) Prophylaxis: sequential compression devices, TED thigh-high  Chemical VTE (DVT) Prophylaxis: aspirin    Fluid Replacement: see anesthesia record  Specimens Removed: 1 to pathology   Sponge and Instrument Count Correct? yes   PACU: portable radiograph - low AP   Plan/RTC: Return in 2 weeks for suture removal. Weight Bearing/Load Lower Extremity: full  Hip precautions: none Suture Removal: 2 weeks   N. Ozell Cummins, MD [facility] 8:59 AM   Implant Name Type Inv. Item Serial No. Manufacturer Lot No. LRB No. Used Action  CUP ACETAB W/GRIPTION 54 - [id number] Plate CUP ACETAB W/GRIPTION 54  [employer] [id number] Right 1 Implanted  LINER NEUTRAL 54X36MM PLUS 4 - [id number] Hips LINER NEUTRAL 54X36MM PLUS 4  [employer]PU[employer] [id number] Right 1 Implanted  SCREW 6.5MMX30MM - [id number] Screw SCREW 6.5MMX30MM  [employer] [id number] Right 1 Implanted  BALL HIP ARTICU EZE 36 8.5 - [id number] Hips BALL HIP ARTICU EZE 36 8.5  [employer] [id number] Right 1 Implanted  STEM FEM ACTIS HIGH SZ7 - [id number] Stem STEM FEM ACTIS HIGH SZ7  [employer] [id number] Right 1 Implanted

## 2024-07-03 NOTE — Anesthesia Preprocedure Evaluation (Addendum)
 Anesthesia Evaluation  Patient identified by MRN, date of birth, ID band Patient awake    Reviewed: Allergy & Precautions, NPO status , Patient's Chart, lab work & pertinent test results  Airway Mallampati: II  TM Distance: >3 FB Neck ROM: Full    Dental  (+) Dental Advisory Given, Teeth Intact   Pulmonary neg pulmonary ROS   Pulmonary exam normal breath sounds clear to auscultation       Cardiovascular hypertension, Pt. on medications Normal cardiovascular exam+ Valvular Problems/Murmurs MR  Rhythm:Regular Rate:Normal  Echo 07/2023  1. Left ventricular ejection fraction, by estimation, is 60 to 65%. The  left ventricle has normal function. The left ventricle has no regional  wall motion abnormalities. There is mild left ventricular hypertrophy.  Left ventricular diastolic parameters  were normal. The average left ventricular global longitudinal strain is  17.3 %. The global longitudinal strain is normal.   2. Right ventricular systolic function is normal. The right ventricular  size is normal.   3. The mitral valve is grossly normal. Mild mitral valve regurgitation.  No evidence of mitral stenosis.   4. The aortic valve is tricuspid. Aortic valve regurgitation is not  visualized. Aortic valve sclerosis is present, with no evidence of aortic  valve stenosis.   5. There is dilatation of the ascending aorta, measuring 39 mm.     Neuro/Psych negative neurological ROS  negative psych ROS   GI/Hepatic negative GI ROS, Neg liver ROS,,,  Endo/Other  Hypothyroidism    Renal/GU negative Renal ROS     Musculoskeletal  (+) Arthritis ,    Abdominal   Peds negative pediatric ROS (+)  Hematology  (+) Blood dyscrasia, anemia   Anesthesia Other Findings   Reproductive/Obstetrics                              Lab Results  Component Value Date   WBC 8.4 06/25/2024   HGB 14.1 06/25/2024   HCT 45.2  06/25/2024   MCV 96.4 06/25/2024   PLT 263 06/25/2024   Lab Results  Component Value Date   CREATININE 0.79 06/25/2024   BUN 26 (H) 06/25/2024   NA 140 06/25/2024   K 4.0 06/25/2024   CL 102 06/25/2024   CO2 26 06/25/2024    Anesthesia Physical Anesthesia Plan  ASA: 3  Anesthesia Plan: Spinal   Post-op Pain Management: Tylenol  PO (pre-op)*   Induction: Intravenous  PONV Risk Score and Plan:   Airway Management Planned:   Additional Equipment:   Intra-op Plan:   Post-operative Plan:   Informed Consent: I have reviewed the patients History and Physical, chart, labs and discussed the procedure including the risks, benefits and alternatives for the proposed anesthesia with the patient or authorized representative who has indicated his/her understanding and acceptance.     Dental advisory given  Plan Discussed with: CRNA  Anesthesia Plan Comments:          Anesthesia Quick Evaluation

## 2024-07-04 ENCOUNTER — Encounter (HOSPITAL_COMMUNITY): Payer: Self-pay | Admitting: Orthopaedic Surgery

## 2024-07-04 DIAGNOSIS — M1611 Unilateral primary osteoarthritis, right hip: Secondary | ICD-10-CM | POA: Diagnosis not present

## 2024-07-04 NOTE — TOC Transition Note (Signed)
 Transition of Care [facility]) - Discharge Note   Patient Details  Name: Dana Duffy MRN: [id number] Date of Birth: [birth year]  Transition of Care [facility] CM/SW Contact:  NORMAN ASPEN, LCSW Phone Number: 07/04/2024, 9:50 AM   Clinical Narrative:     Met with pt who confirms she does need RW for home and no DME agency preference - order placed with [employer] and RW delivered to room.  HHPT prearranged with [facility] via ortho MD office prior to surgery.  No further IP CM needs.  Final next level of care: Home w Home Health Services Barriers to Discharge: No Barriers Identified   Patient Goals and CMS Choice Patient states their goals for this hospitalization and ongoing recovery are:: return home          Discharge Placement                       Discharge Plan and Services Additional resources added to the After Visit Summary for                  DME Arranged: Walker rolling DME Agency: [employer]       HH Arranged: PT HH Agency: [facility], academic)        Social Drivers of Health (SDOH) Interventions SDOH Screenings   Food Insecurity: No Food Insecurity (07/03/2024)  Housing: Low Risk  (07/03/2024)  Transportation Needs: No Transportation Needs (07/03/2024)  Utilities: Not At Risk (07/03/2024)  Financial Resource Strain: Low Risk  (05/19/2024)   Received from [facility]  Physical Activity: Inactive (05/19/2024)   Received from [facility]  Social Connections: Unknown (07/03/2024)  Stress: Stress Concern Present (05/19/2024)   Received from [facility]  Tobacco Use: Low Risk  (07/03/2024)     Readmission Risk Interventions     No data to display

## 2024-07-04 NOTE — Progress Notes (Signed)
 Subjective: 1 Day Post-Op Procedure(s) (LRB): ARTHROPLASTY, HIP, TOTAL, ANTERIOR APPROACH (Right) Patient reports pain as mild.    Objective: Vital signs in last 24 hours: Temp:  [95.8 F (35.4 C)-99 F (37.2 C)] 98.2 F (36.8 C) (09/12 0540) Pulse Rate:  [47-76] 66 (09/12 0540) Resp:  [11-26] 18 (09/12 0540) BP: (101-133)/(49-68) 124/68 (09/12 0540) SpO2:  [90 %-99 %] 94 % (09/12 0540) Weight:  [74.8 kg] 74.8 kg (09/11 1344)  Intake/Output from previous day: 09/11 0701 - 09/12 0700 In: 3232.3 [P.O.:960; I.V.:1872.3; IV Piggyback:400] Out: 1550 [Urine:1350; Blood:200] Intake/Output this shift: Total I/O In: 1296.5 [P.O.:480; I.V.:816.5] Out: -   No results for input(s): HGB in the last 72 hours. No results for input(s): WBC, RBC, HCT, PLT in the last 72 hours. No results for input(s): NA, K, CL, CO2, BUN, CREATININE, GLUCOSE, CALCIUM in the last 72 hours. No results for input(s): LABPT, INR in the last 72 hours.  Neurologically intact Neurovascular intact Sensation intact distally Intact pulses distally Dorsiflexion/Plantar flexion intact Incision: dressing C/D/I No cellulitis present Compartment soft   Assessment/Plan: 1 Day Post-Op Procedure(s) (LRB): ARTHROPLASTY, HIP, TOTAL, ANTERIOR APPROACH (Right) Advance diet Up with therapy D/C IV fluids Discharge home with home health once able to urinate on her own and is cleared by PT.  Bedroom is on second floor of house so will need adequate stair training prior to d/c WBAT RLE Post-op meds sent to pharmacy pre-op.  Patient has not picked these up.      Ronal LITTIE Grave 07/04/2024, 6:54 AM

## 2024-07-04 NOTE — Care Management Obs Status (Signed)
 MEDICARE OBSERVATION STATUS NOTIFICATION   Patient Details  Name: Dana Duffy MRN: [id number] Date of Birth: 1946-09-29   Medicare Observation Status Notification Given:  Yes    NORMAN ASPEN, LCSW 07/04/2024, 9:37 AM

## 2024-07-04 NOTE — Discharge Summary (Signed)
 Patient ID: Dana Duffy MRN: [id number] DOB/AGE: October 17, 1946 78 y.o.  Admit date: 07/03/2024 Discharge date: 07/04/2024  Admission Diagnoses:  Principal Problem:   Primary osteoarthritis of right hip Active Problems:   Status post total replacement of right hip   Discharge Diagnoses:  Same  Past Medical History:  Diagnosis Date   Allergy    Anemia    when younger no longer an issue   Arthritis    Cholelithiases    Diverticulosis    High cholesterol    Hypertension    Hypothyroidism    Thyroid disease     Surgeries: Procedure(s): ARTHROPLASTY, HIP, TOTAL, ANTERIOR APPROACH on 07/03/2024   Consultants:   Discharged Condition: Improved  Hospital Course: Polina Burmaster is an 78 y.o. female who was admitted 07/03/2024 for operative treatment ofPrimary osteoarthritis of right hip. Patient has severe unremitting pain that affects sleep, daily activities, and work/hobbies. After pre-op clearance the patient was taken to the operating room on 07/03/2024 and underwent  Procedure(s): ARTHROPLASTY, HIP, TOTAL, ANTERIOR APPROACH.    Patient was given perioperative antibiotics:  Anti-infectives (From admission, onward)    Start     Dose/Rate Route Frequency Ordered Stop   07/03/24 1400  ceFAZolin  (ANCEF ) IVPB 2g/100 mL premix        2 g 200 mL/hr over 30 Minutes Intravenous Every 6 hours 07/03/24 1215 07/03/24 2204   07/03/24 0855  vancomycin  (VANCOCIN ) powder  Status:  Discontinued          As needed 07/03/24 0855 07/03/24 0938   07/03/24 0600  ceFAZolin  (ANCEF ) IVPB 2g/100 mL premix        2 g 200 mL/hr over 30 Minutes Intravenous On call to O.R. 07/03/24 0549 07/03/24 0749        Patient was given sequential compression devices, early ambulation, and chemoprophylaxis to prevent DVT.  Inpatient Morphine  Milligram Equivalents Per Day 9/11 - 9/12   Values displayed are in units of MME/Day    Order Start / End Date Yesterday Today    fentaNYL  (SUBLIMAZE ) injection 25-50 mcg  9/11 - 9/11 0 of 45-90 --    HYDROcodone -acetaminophen  (NORCO/VICODIN) 5-325 MG per tablet 1-2 tablet 9/11 - No end date 10 of 10-20 5 of 15-30    HYDROcodone -acetaminophen  (NORCO) 7.5-325 MG per tablet 1-2 tablet 9/11 - No end date 0 of 15-30 0 of 22.5-45    morphine  (PF) 2 MG/ML injection 0.5-1 mg 9/11 - No end date 0 of 3-6 0 of 6-12    fentaNYL  (SUBLIMAZE ) injection 9/11 - 9/11 *30 of 30 --    Daily Totals  * 40 of 103-176 5 of 43.5-87  *One-Step medication     Patient benefited maximally from hospital stay and there were no complications.    Recent vital signs: Patient Vitals for the past 24 hrs:  BP Temp Temp src Pulse Resp SpO2 Height Weight  07/04/24 0540 124/68 98.2 F (36.8 C) Oral 66 18 94 % -- --  07/03/24 2341 (!) 125/58 97.8 F (36.6 C) Oral 62 18 95 % -- --  07/03/24 2021 106/68 97.9 F (36.6 C) Oral 68 17 96 % -- --  07/03/24 1403 122/67 (!) 97.4 F (36.3 C) Oral 67 16 95 % -- --  07/03/24 1344 -- -- -- -- -- -- 5' 4 (1.626 m) 74.8 kg  07/03/24 1331 133/66 (!) 97.5 F (36.4 C) Oral 75 20 98 % -- --  07/03/24 1200 127/63 (!) 97.5 F (36.4 C) Oral 76 15 95 % -- --  07/03/24 1153 -- 99 F (37.2 C) -- -- -- -- -- --  07/03/24 1145 109/66 -- -- 63 11 96 % -- --  07/03/24 1132 110/67 -- -- 64 17 96 % -- --  07/03/24 1115 123/63 (!) 96.3 F (35.7 C) -- (!) 57 16 97 % -- --  07/03/24 1100 127/61 -- -- (!) 48 13 90 % -- --  07/03/24 1045 120/66 (!) 95.8 F (35.4 C) -- (!) 54 (!) 26 96 % -- --  07/03/24 1030 (!) 125/57 -- -- (!) 47 11 99 % -- --  07/03/24 1015 120/60 -- -- (!) 51 12 96 % -- --  07/03/24 1000 (!) 102/57 -- -- (!) 51 15 93 % -- --  07/03/24 0945 (!) 101/52 -- -- (!) 59 13 96 % -- --  07/03/24 0938 -- -- -- -- -- 98 % -- --  07/03/24 0930 (!) 101/49 -- -- (!) 55 13 97 % -- --  07/03/24 0929 (!) 101/49 97.6 F (36.4 C) -- (!) 55 13 97 % -- --     Recent laboratory studies: No results for input(s): WBC, HGB, HCT, PLT, NA, K, CL, CO2,  BUN, CREATININE, GLUCOSE, INR, CALCIUM in the last 72 hours.  Invalid input(s): PT, 2   Discharge Medications:   Allergies as of 07/04/2024       Reactions   Gatifloxacin Other (See Comments)   Gluten Meal Other (See Comments)   Avoids gluten   Sulfa Antibiotics Other (See Comments), Rash   SKIN REDNESS        Medication List     TAKE these medications    aspirin  81 MG chewable tablet Commonly known as: Aspirin  81 Chew 1 tablet (81 mg total) by mouth 2 (two) times daily. To be taken after surgery to prevent blood clots   docusate sodium  100 MG capsule Commonly known as: Colace Take 1 capsule (100 mg total) by mouth daily as needed.   FIBER PO Take 1 tablet by mouth every other day.   levothyroxine  100 MCG tablet Commonly known as: SYNTHROID  TAKE 1 TABLET ONCE DAILY.   losartan -hydrochlorothiazide  100-12.5 MG tablet Commonly known as: HYZAAR Take 1 tablet by mouth daily.   methocarbamol  750 MG tablet Commonly known as: ROBAXIN  Take 1 tablet (750 mg total) by mouth 3 (three) times daily as needed.   multivitamin with minerals tablet Take 1 tablet by mouth at bedtime.   ondansetron  4 MG tablet Commonly known as: Zofran  Take 1 tablet (4 mg total) by mouth every 8 (eight) hours as needed for nausea or vomiting.   oxyCODONE -acetaminophen  5-325 MG tablet Commonly known as: Percocet Take 1-2 tablets by mouth every 6 (six) hours as needed.   simvastatin  20 MG tablet Commonly known as: ZOCOR  TAKE ONE TABLET AT BEDTIME.               Durable Medical Equipment  (From admission, onward)           Start     Ordered   07/03/24 1216  DME Walker rolling  Once       Question:  Patient needs a walker to treat with the following condition  Answer:  History of hip replacement   07/03/24 1215   07/03/24 1216  DME 3 n 1  Once        07/03/24 1215   07/03/24 1216  DME Bedside commode  Once       Question:  Patient needs a bedside commode to  treat with the following condition  Answer:  History of hip replacement   07/03/24 1215            Diagnostic Studies: DG Pelvis Portable Result Date: 07/03/2024 CLINICAL DATA:  Status post right total hip arthroplasty. EXAM: PORTABLE PELVIS 1-2 VIEWS COMPARISON:  Mar 12, 2024. FINDINGS: The right acetabular and femoral components are well situated. Expected postoperative changes seen in the surrounding soft tissues. IMPRESSION: Status post right total hip arthroplasty. Electronically Signed   By: Lynwood Landy Raddle M.D.   On: 07/03/2024 10:48   DG HIP UNILAT WITH PELVIS 1V RIGHT Result Date: 07/03/2024 CLINICAL DATA:  Right hip arthroplasty. EXAM: DG HIP (WITH OR WITHOUT PELVIS) 1V RIGHT; DG C-ARM 1-60 MIN-NO REPORT Radiation exposure index: 2.7189 mGy COMPARISON:  Mar 12, 2024. FINDINGS: Two intraoperative fluoroscopic images were obtained of the right hip. Right acetabular and femoral components appear to be well situated. IMPRESSION: Fluoroscopic guidance provided during right total hip arthroplasty. Electronically Signed   By: Lynwood Landy Raddle M.D.   On: 07/03/2024 10:47   DG C-Arm 1-60 Min-No Report Result Date: 07/03/2024 CLINICAL DATA:  Right hip arthroplasty. EXAM: DG HIP (WITH OR WITHOUT PELVIS) 1V RIGHT; DG C-ARM 1-60 MIN-NO REPORT Radiation exposure index: 2.7189 mGy COMPARISON:  Mar 12, 2024. FINDINGS: Two intraoperative fluoroscopic images were obtained of the right hip. Right acetabular and femoral components appear to be well situated. IMPRESSION: Fluoroscopic guidance provided during right total hip arthroplasty. Electronically Signed   By: Lynwood Landy Raddle M.D.   On: 07/03/2024 10:47   DG C-Arm 1-60 Min-No Report Result Date: 07/03/2024 CLINICAL DATA:  Right hip arthroplasty. EXAM: DG HIP (WITH OR WITHOUT PELVIS) 1V RIGHT; DG C-ARM 1-60 MIN-NO REPORT Radiation exposure index: 2.7189 mGy COMPARISON:  Mar 12, 2024. FINDINGS: Two intraoperative fluoroscopic images were obtained of the  right hip. Right acetabular and femoral components appear to be well situated. IMPRESSION: Fluoroscopic guidance provided during right total hip arthroplasty. Electronically Signed   By: Lynwood Landy Raddle M.D.   On: 07/03/2024 10:47    Disposition: Discharge disposition: 01-Home or Self Care          Follow-up Information     Jule Ronal CROME, PA-C. Schedule an appointment as soon as possible for a visit in 2 week(s).   Specialty: Orthopedic Surgery Contact information: [address] [place]  [place] [address] [phone]                  Signed: Ronal CROME Jule 07/04/2024, 6:56 AM

## 2024-07-04 NOTE — Progress Notes (Signed)
 Physical Therapy Treatment Patient Details Name: Dana Duffy MRN: [id number] DOB: [birth year] Today's Date: 07/04/2024   History of Present Illness Pt s/p R anterior THA on 07/03/24.  Pt with hx including but not limited to arthritis.    PT Comments  Pt is POD # 1 and is progressing well.  She has good pain control, ROM, and safety awareness.  Pt likes to exercises and is familiar with her HEP and is getting HHPT at d/c.  She was able to ambulate 50' safely and performed stairs similar to home set up. Pt demonstrates safe gait & transfers in order to return home from PT perspective once discharged by MD.  While in hospital, will continue to benefit from PT for skilled therapy to advance mobility and exercises.       If plan is discharge home, recommend the following: A little help with walking and/or transfers;A little help with bathing/dressing/bathroom;Assistance with cooking/housework;Assist for transportation;Help with stairs or ramp for entrance   Can travel by private vehicle        Equipment Recommendations  Rolling walker (2 wheels)    Recommendations for Other Services       Precautions / Restrictions Precautions Precautions: Fall Restrictions RLE Weight Bearing Per Provider Order: Weight bearing as tolerated     Mobility  Bed Mobility               General bed mobility comments: in chair at arrival (bed not made)    Transfers Overall transfer level: Needs assistance Equipment used: Rolling walker (2 wheels) Transfers: Sit to/from Stand Sit to Stand: Supervision           General transfer comment: good hand placement and controlled descent    Ambulation/Gait Ambulation/Gait assistance: Supervision Gait Distance (Feet): 80 Feet Assistive device: Rolling walker (2 wheels) Gait Pattern/deviations: Step-through pattern, Decreased stride length Gait velocity: functional     General Gait Details: Good RW proximity; slight decrease in weight shift to R;  steady   Stairs Stairs: Yes Stairs assistance: Contact guard assist   Number of Stairs: 10 General stair comments: Started with bil rails to simulate in home stairs.  Perform 2 platform type stairs with RW to simulate outside stairs.  CGA for safety; pt prefers good leg first for up and down; steady ; discussed leaving her standard walker upstairs and RW downstairs, or could have sister transport RW up/down if prefers to have it upstairs too   Wheelchair Mobility     Tilt Bed    Modified Rankin (Stroke Patients Only)       Balance Overall balance assessment: Needs assistance Sitting-balance support: No upper extremity supported, Feet supported Sitting balance-Leahy Scale: Good     Standing balance support: Bilateral upper extremity supported, No upper extremity supported Standing balance-Leahy Scale: Fair Standing balance comment: RW to ambulate; could static stand without support                            Communication    Cognition Arousal: Alert Behavior During Therapy: WFL for tasks assessed/performed   PT - Cognitive impairments: No apparent impairments                                Cueing    Exercises Total Joint Exercises Ankle Circles/Pumps: AROM, Both, 10 reps, Seated Quad Sets: AROM, Both, 10 reps, Supine Heel Slides: AAROM, Right, 10 reps, Supine,  Limitations Heel Slides Limitations: gait belt to assist Hip ABduction/ADduction: AAROM, Right, 10 reps, Supine, Limitations Hip Abduction/Adduction Limitations: gait belt to assist; cues to keep toes up to limit rotation Long Arc Quad: AROM, 10 reps, Both, Seated    General Comments General comments (skin integrity, edema, etc.): VSS   Educated on safe ice use, no pivots, car transfers, and TED hose during day. Also, encouraged walking every 1-2 hours during day for 5-10 mins. Educated on HEP with focus on mobility the first week. Discussed doing exercises within pain control and if  pain increasing could decreased ROM, reps, and stop exercises as needed. Encouraged to perform ankle pumps frequently for blood flow.    Pertinent Vitals/Pain Pain Assessment Pain Assessment: 0-10 Pain Score: 3  Pain Location: R hip Pain Descriptors / Indicators: Aching, Sore Pain Intervention(s): Limited activity within patient's tolerance, Monitored during session, Premedicated before session, Repositioned, Ice applied    Home Living                          Prior Function            PT Goals (current goals can now be found in the care plan section) Progress towards PT goals: Progressing toward goals    Frequency    7X/week      PT Plan      Co-evaluation              AM-PAC PT 6 Clicks Mobility   Outcome Measure  Help needed turning from your back to your side while in a flat bed without using bedrails?: A Little Help needed moving from lying on your back to sitting on the side of a flat bed without using bedrails?: A Little Help needed moving to and from a bed to a chair (including a wheelchair)?: A Little Help needed standing up from a chair using your arms (e.g., wheelchair or bedside chair)?: A Little Help needed to walk in hospital room?: A Little Help needed climbing 3-5 steps with a railing? : A Little 6 Click Score: 18    End of Session Equipment Utilized During Treatment: Gait belt Activity Tolerance: Patient tolerated treatment well Patient left: in chair;with call bell/phone within reach;with chair alarm set Nurse Communication: Mobility status PT Visit Diagnosis: Unsteadiness on feet (R26.81);Difficulty in walking, not elsewhere classified (R26.2)     Time: 1020-1050 PT Time Calculation (min) (ACUTE ONLY): 30 min  Charges:    $Gait Training: 8-22 mins $Therapeutic Exercise: 8-22 mins PT General Charges $$ ACUTE PT VISIT: 1 Visit                     Benjiman, PT Acute Rehab Services [facility]  [phone]    Benjiman [name] 07/04/2024, 11:00 AM

## 2024-07-10 ENCOUNTER — Telehealth: Payer: Self-pay

## 2024-07-10 NOTE — Telephone Encounter (Signed)
 Patient called stating that she is having pain in her right hip when she walks that is radiating down to her right leg.  Patient had right hip surgery on 07/03/2024.  CB# [phone].  Please advise.  Thank You

## 2024-07-11 ENCOUNTER — Encounter: Admitting: Physician Assistant

## 2024-07-18 ENCOUNTER — Ambulatory Visit: Admitting: Physician Assistant

## 2024-07-18 ENCOUNTER — Encounter: Payer: Self-pay | Admitting: Physician Assistant

## 2024-07-18 DIAGNOSIS — M1611 Unilateral primary osteoarthritis, right hip: Secondary | ICD-10-CM

## 2024-07-18 NOTE — Progress Notes (Signed)
 Post-Op Visit Note   Patient: Dana Duffy           Date of Birth: July 25, 1946           MRN: [id number] Visit Date: 07/18/2024 PCP: Gladystine Erminio CROME, MD   Assessment & Plan:  Chief Complaint:  Chief Complaint  Patient presents with   Right Hip - Follow-up    Right THA 06/26/2024   Visit Diagnoses:  1. Primary osteoarthritis of right hip     Plan: Patient is a pleasant 78 year old female who comes in today approximately 3 weeks status post right total hip replacement 06/26/2024.  She has been doing okay.  She has been taking oxycodone  and Robaxin  for pain.  She has been compliant taking aspirin  twice daily for DVT prophylaxis.  Currently ambulating with a walker.  Examination of her right hip reveals well-healing surgical incision with nylon sutures in place.  No evidence of infection or cellulitis.  Calves are soft nontender.  She is neurovascular intact distally.  At this point, she will continue with her aspirin  twice daily for another 3 weeks.  She will follow-up with us  in 3 weeks for repeat evaluation and AP pelvis x-rays.  Call with concerns or questions.  Follow-Up Instructions: Return in about 3 weeks (around 08/08/2024).   Orders:  No orders of the defined types were placed in this encounter.  No orders of the defined types were placed in this encounter.   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   Hammer toe of left foot 06/18/2014   BMI 27.0-27.9,adult 06/12/2012   Hypothyroidism 06/12/2012   Hyperlipidemia 06/12/2012   Past Medical History:  Diagnosis Date   Allergy    Anemia    when younger no longer an issue   Arthritis    Cholelithiases    Diverticulosis    High cholesterol    Hypertension     Hypothyroidism    Thyroid disease     Family History  Problem Relation Age of Onset   Heart disease Mother    Hyperlipidemia Mother    Hypertension Mother    Asthma Son    Hyperlipidemia Maternal Grandmother    Diabetes Father    Breast cancer Sister 15    Past Surgical History:  Procedure Laterality Date   BUNIONECTOMY     CHOLECYSTECTOMY N/A 12/06/2015   Procedure: LAPAROSCOPIC CHOLECYSTECTOMY;  Surgeon: Donnice Bury, MD;  Location: [facility];  Service: General;  Laterality: N/A;   DIAGNOSTIC LAPAROSCOPY     when had toxic shock syndrome   LAPAROSCOPIC APPENDECTOMY N/A 06/12/2017   Procedure: APPENDECTOMY LAPAROSCOPIC;  Surgeon: Ethyl Lenis, MD;  Location: [facility];  Service: General;  Laterality: N/A;   TOTAL HIP ARTHROPLASTY Right 07/03/2024   Procedure: ARTHROPLASTY, HIP, TOTAL, ANTERIOR APPROACH;  Surgeon: Jerri Kay HERO, MD;  Location: [facility];  Service: Orthopedics;  Laterality: Right;   Social History   Occupational History   Occupation: Artist  Tobacco Use   Smoking status: Never   Smokeless tobacco: Never  Vaping Use   Vaping status: Never Used  Substance and Sexual Activity   Alcohol  use: Yes    Alcohol /week: 2.0 standard drinks of alcohol     Types: 2 Glasses of wine per week  Comment: very little   Drug use: No   Sexual activity: Not Currently    Comment: 1st intercourse 78 yo-Fewer than 5 partners

## 2024-07-28 ENCOUNTER — Telehealth: Payer: Self-pay

## 2024-07-28 NOTE — Telephone Encounter (Signed)
 Okay for her to use heat?

## 2024-07-28 NOTE — Telephone Encounter (Signed)
 Patient called and would like to know if she is able to use a heating pad on her right hip instead of taking a muscle relaxer.  Stated that she has some stiffness.  Patient had right hip surgery on 07/03/2024.  CB# [phone].  Please advise.

## 2024-07-29 NOTE — Telephone Encounter (Signed)
Called and relayed to patient. °

## 2024-08-08 ENCOUNTER — Ambulatory Visit (INDEPENDENT_AMBULATORY_CARE_PROVIDER_SITE_OTHER): Admitting: Physician Assistant

## 2024-08-08 ENCOUNTER — Other Ambulatory Visit (INDEPENDENT_AMBULATORY_CARE_PROVIDER_SITE_OTHER): Payer: Self-pay

## 2024-08-08 DIAGNOSIS — M1611 Unilateral primary osteoarthritis, right hip: Secondary | ICD-10-CM

## 2024-08-08 NOTE — Progress Notes (Signed)
 Post-Op Visit Note   Patient: Dana Duffy           Date of Birth: 03-Aug-1946           MRN: [id number] Visit Date: 08/08/2024 PCP: Gladystine Erminio CROME, MD   Assessment & Plan:  Chief Complaint:  Chief Complaint  Patient presents with   Right Hip - Routine Post Op   Visit Diagnoses:  1. Primary osteoarthritis of right hip     Plan: Patient is a pleasant 78 year old female who comes in today 5 weeks status post right total hip replacement.  She has been doing okay.  She notes start up stiffness with occasional pain and paresthesias running down the right lateral leg.  She has been taking an occasional methocarbamol .  She has been compliant taking her aspirin  twice daily for DVT prophylaxis.  Examination of the right hip reveals painless hip flexion and logroll.  She is neurovascularly intact distally.  At this point, she will start back with her home exercise program.  She will take her baby aspirin  twice daily until she is 6 weeks postop.  She will follow-up in 7 weeks for recheck.  Call with concerns or questions.  Follow-Up Instructions: Return in about 7 weeks (around 09/26/2024).   Orders:  Orders Placed This Encounter  Procedures   XR HIP UNILAT W OR W/O PELVIS 2-3 VIEWS RIGHT   No orders of the defined types were placed in this encounter.   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   Hammer toe of left foot 06/18/2014   BMI 27.0-27.9,adult 06/12/2012   Hypothyroidism 06/12/2012   Hyperlipidemia 06/12/2012   Past Medical History:  Diagnosis Date   Allergy    Anemia    when younger no longer an issue   Arthritis     Cholelithiases    Diverticulosis    High cholesterol    Hypertension    Hypothyroidism    Thyroid disease     Family History  Problem Relation Age of Onset   Heart disease Mother    Hyperlipidemia Mother    Hypertension Mother    Asthma Son    Hyperlipidemia Maternal Grandmother    Diabetes Father    Breast cancer Sister 42    Past Surgical History:  Procedure Laterality Date   BUNIONECTOMY     CHOLECYSTECTOMY N/A 12/06/2015   Procedure: LAPAROSCOPIC CHOLECYSTECTOMY;  Surgeon: Donnice Bury, MD;  Location: [facility];  Service: General;  Laterality: N/A;   DIAGNOSTIC LAPAROSCOPY     when had toxic shock syndrome   LAPAROSCOPIC APPENDECTOMY N/A 06/12/2017   Procedure: APPENDECTOMY LAPAROSCOPIC;  Surgeon: Ethyl Lenis, MD;  Location: [facility];  Service: General;  Laterality: N/A;   TOTAL HIP ARTHROPLASTY Right 07/03/2024   Procedure: ARTHROPLASTY, HIP, TOTAL, ANTERIOR APPROACH;  Surgeon: Jerri Kay HERO, MD;  Location: [facility];  Service: Orthopedics;  Laterality: Right;   Social History   Occupational History   Occupation: Artist  Tobacco Use   Smoking status: Never   Smokeless tobacco: Never  Vaping Use   Vaping status: Never Used  Substance and Sexual Activity   Alcohol  use: Yes  Alcohol /week: 2.0 standard drinks of alcohol     Types: 2 Glasses of wine per week    Comment: very little   Drug use: No   Sexual activity: Not Currently    Comment: 1st intercourse 78 yo-Fewer than 5 partners

## 2024-08-25 ENCOUNTER — Encounter: Payer: Self-pay | Admitting: Radiology

## 2024-09-24 ENCOUNTER — Ambulatory Visit: Admitting: Physician Assistant

## 2024-09-24 DIAGNOSIS — Z96641 Presence of right artificial hip joint: Secondary | ICD-10-CM

## 2024-09-24 NOTE — Progress Notes (Signed)
 Post-Op Visit Note   Patient: Dana Duffy           Date of Birth: 1945-11-23           MRN: [id number] Visit Date: 09/24/2024 PCP: Gladystine Erminio CROME, MD   Assessment & Plan:  Chief Complaint:  Chief Complaint  Patient presents with   Right Hip - Routine Post Op    R THA 06/26/2024   Visit Diagnoses:  1. Status post total replacement of right hip     Plan: Patient is a pleasant 78 year old female who comes in today 3 months status post right total hip replacement 06/26/2024.  She has been doing great.  No pain.  Overall very pleased with the outcome of her surgery.  Examination of the right hip reveals painless hip flexion logroll.  She is neurovascularly intact distally.  At this point, she will continue to advance with activity as tolerated.  Dental prophylaxis reinforced.  Follow-up in 3 months for repeat evaluation and AP pelvis x-rays.  Call with concerns or questions.  Follow-Up Instructions: Return in about 3 months (around 12/23/2024).   Orders:  No orders of the defined types were placed in this encounter.  No orders of the defined types were placed in this encounter.   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   Hammer toe of left foot 06/18/2014   BMI 27.0-27.9,adult 06/12/2012   Hypothyroidism 06/12/2012   Hyperlipidemia 06/12/2012   Past Medical History:  Diagnosis Date   Allergy    Anemia    when younger no longer an issue   Arthritis    Cholelithiases    Diverticulosis    High cholesterol    Hypertension    Hypothyroidism    Thyroid disease     Family History  Problem Relation Age of Onset   Heart disease Mother    Hyperlipidemia Mother    Hypertension Mother    Asthma Son    Hyperlipidemia Maternal  Grandmother    Diabetes Father    Breast cancer Sister 61    Past Surgical History:  Procedure Laterality Date   BUNIONECTOMY     CHOLECYSTECTOMY N/A 12/06/2015   Procedure: LAPAROSCOPIC CHOLECYSTECTOMY;  Surgeon: Donnice Bury, MD;  Location: [facility];  Service: General;  Laterality: N/A;   DIAGNOSTIC LAPAROSCOPY     when had toxic shock syndrome   LAPAROSCOPIC APPENDECTOMY N/A 06/12/2017   Procedure: APPENDECTOMY LAPAROSCOPIC;  Surgeon: Ethyl Lenis, MD;  Location: [facility];  Service: General;  Laterality: N/A;   TOTAL HIP ARTHROPLASTY Right 07/03/2024   Procedure: ARTHROPLASTY, HIP, TOTAL, ANTERIOR APPROACH;  Surgeon: Jerri Kay HERO, MD;  Location: [facility];  Service: Orthopedics;  Laterality: Right;   Social History   Occupational History   Occupation: Artist  Tobacco Use   Smoking status: Never   Smokeless tobacco: Never  Vaping Use   Vaping status: Never Used  Substance and Sexual Activity   Alcohol  use: Yes    Alcohol /week: 2.0 standard drinks of alcohol     Types: 2 Glasses of wine per week    Comment: very little   Drug use: No   Sexual activity: Not Currently    Comment: 1st intercourse 78 yo-Fewer than 5 partners

## 2024-12-24 ENCOUNTER — Ambulatory Visit: Admitting: Orthopaedic Surgery
